# Patient Record
Sex: Male | Born: 1985 | Race: Black or African American | Hispanic: No | Marital: Single | Smoking: Current every day smoker
Health system: Southern US, Community
[De-identification: ages and names within clinical notes are randomized; demographics above are authoritative.]

## PROBLEM LIST (undated history)

## (undated) DIAGNOSIS — J189 Pneumonia, unspecified organism: Secondary | ICD-10-CM

---
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
## 2006-01-16 ENCOUNTER — Emergency Department (HOSPITAL_COMMUNITY): Admission: EM | Admit: 2006-01-16 | Discharge: 2006-01-16 | Payer: Self-pay | Admitting: Emergency Medicine

## 2010-02-08 ENCOUNTER — Emergency Department (HOSPITAL_COMMUNITY): Admission: EM | Admit: 2010-02-08 | Discharge: 2010-02-09 | Payer: Self-pay | Admitting: Emergency Medicine

## 2010-10-01 ENCOUNTER — Emergency Department (HOSPITAL_COMMUNITY)
Admission: EM | Admit: 2010-10-01 | Discharge: 2010-10-01 | Disposition: A | Discharge status: Home / Self Care | Payer: Self-pay | Attending: Emergency Medicine | Admitting: Emergency Medicine

## 2010-10-01 DIAGNOSIS — J029 Acute pharyngitis, unspecified: Secondary | ICD-10-CM | POA: Insufficient documentation

## 2012-02-18 ENCOUNTER — Emergency Department (HOSPITAL_COMMUNITY)
Admission: EM | Admit: 2012-02-18 | Discharge: 2012-02-19 | Disposition: A | Discharge status: Home / Self Care | Payer: Self-pay | Attending: Emergency Medicine | Admitting: Emergency Medicine

## 2012-02-18 ENCOUNTER — Encounter (HOSPITAL_COMMUNITY): Payer: Self-pay | Admitting: *Deleted

## 2012-02-18 DIAGNOSIS — R197 Diarrhea, unspecified: Secondary | ICD-10-CM | POA: Insufficient documentation

## 2012-02-18 DIAGNOSIS — R109 Unspecified abdominal pain: Secondary | ICD-10-CM | POA: Insufficient documentation

## 2012-02-18 DIAGNOSIS — R112 Nausea with vomiting, unspecified: Secondary | ICD-10-CM | POA: Insufficient documentation

## 2012-02-18 MED ORDER — DIPHENOXYLATE-ATROPINE 2.5-0.025 MG PO TABS
2.0000 | ORAL_TABLET | Freq: Once | ORAL | Status: AC
  Administered 2012-02-18: 2 via ORAL
  Filled 2012-02-18: qty 2

## 2012-02-18 MED ORDER — MORPHINE SULFATE 4 MG/ML IJ SOLN
6.0000 mg | Freq: Once | INTRAMUSCULAR | Status: AC
  Administered 2012-02-18: 6 mg via INTRAVENOUS
  Filled 2012-02-18: qty 2

## 2012-02-18 MED ORDER — ONDANSETRON HCL 4 MG/2ML IJ SOLN
6.0000 mg | Freq: Once | INTRAMUSCULAR | Status: AC
  Administered 2012-02-18: 6 mg via INTRAVENOUS
  Filled 2012-02-18: qty 4

## 2012-02-18 MED ORDER — SODIUM CHLORIDE 0.9 % IV BOLUS (SEPSIS)
1000.0000 mL | Freq: Once | INTRAVENOUS | Status: AC
  Administered 2012-02-18: 1000 mL via INTRAVENOUS

## 2012-02-18 MED ORDER — ONDANSETRON HCL 4 MG PO TABS: 4.0000 mg | ORAL_TABLET | Freq: Four times a day (QID) | ORAL | Status: DC

## 2012-02-18 NOTE — ED Notes (Signed)
Pt reporting improvement in pain and nausea.  States "I'm just tired now."  Pt to call family member for ride home.

## 2012-02-18 NOTE — ED Notes (Signed)
abd pain with NVD. chills

## 2012-02-18 NOTE — ED Notes (Signed)
Pt reporting generalized aches, nausea, diarrhea x2 days.  Denies known fever. Reporting fatigue and loss of appetite as well.

## 2012-02-18 NOTE — ED Provider Notes (Signed)
History  This chart was scribed for Raeford Razor, MD by Bennett Scrape. This patient was seen in room APA10/APA10 and the patient's care was started at 9:25PM.  CSN: [id number]  Arrival date & time 02/18/12  2044   First MD Initiated Contact with Patient 02/18/12 2125      Chief Complaint  Patient presents with  . Abdominal Pain     The history is provided by the patient. No language interpreter was used.   Carl Watson is a 26 y.o. male who presents to the Emergency Department complaining of 2 days of intermittent periumbilical abdominal pain described as sharp that occurs every 5 to 10 minutes with associated nausea, non-bloody emesis, non-bloody diarrhea and chills. He denies having any modifying factors and he reports taking Pepto and Gas X with mild improvement in his symptoms. He denies having any sick contacts with similar symptoms. He denies any recent out of country travels or antibiotic use. He denies fever, urinary symptoms, HA and rash as associated symptoms. He does not have a h/o chronic medical conditions and denies having prior abdominal surgery. He is a current everyday smoker and occasional alcohol user.   History reviewed. No pertinent past medical history.  History reviewed. No pertinent past surgical history.  History reviewed. No pertinent family history.  History  Substance Use Topics  . Smoking status: Current Every Day Smoker  . Smokeless tobacco: Not on file  . Alcohol Use: Yes      Review of Systems  Constitutional: Positive for chills. Negative for fever.  Gastrointestinal: Positive for nausea, vomiting, abdominal pain and diarrhea.  Genitourinary: Negative for dysuria, urgency and hematuria.  Musculoskeletal: Negative for myalgias.  All other systems reviewed and are negative.    Allergies  Review of patient's allergies indicates no known allergies.  Home Medications  No current outpatient prescriptions on file.  Triage Vitals: BP 150/76   Pulse 86  Temp 98.8 F (37.1 C) (Oral)  Resp 18  Ht 5\' 9"  (1.753 m)  Wt 185 lb (83.915 kg)  BMI 27.32 kg/m2  SpO2 98%  Physical Exam  Nursing note and vitals reviewed. Constitutional: He is oriented to person, place, and time. He appears well-developed and well-nourished.  HENT:  Head: Normocephalic and atraumatic.  Eyes: Conjunctivae normal and EOM are normal. Pupils are equal, round, and reactive to light.  Neck: Normal range of motion. Neck supple.  Cardiovascular: Normal rate, regular rhythm and normal heart sounds.   Pulmonary/Chest: Effort normal and breath sounds normal. No respiratory distress. He has no wheezes. He has no rales.  Abdominal: Soft. Bowel sounds are normal. He exhibits no distension and no mass. There is no tenderness. There is no rebound and no guarding.  Musculoskeletal: Normal range of motion.  Neurological: He is alert and oriented to person, place, and time.  Skin: Skin is warm and dry.  Psychiatric: He has a normal mood and affect.    ED Course  Procedures (including critical care time)  DIAGNOSTIC STUDIES: Oxygen Saturation is 98% on room air, normal by my interpretation.    COORDINATION OF CARE: 10:05PM-Discussed treatment plan which includes IV fluids and antiemetics with pt at bedside and pt agreed to plan.   Labs Reviewed - No data to display No results found.   1. Nausea and vomiting   2. Diarrhea   3. Abdominal pain       MDM  26yM with n/v/d. Suspect viral illness. Better with meds and IVF. Do not feel needs emergent  imaging or labs. Return precautions discussed. Continued symptomatic tx.      I personally preformed the services scribed in my presence. The recorded information has been reviewed and considered. Raeford Razor, MD.    Raeford Razor, MD 02/28/12 [phone]

## 2012-03-25 ENCOUNTER — Encounter (HOSPITAL_COMMUNITY): Payer: Self-pay | Admitting: *Deleted

## 2012-03-25 ENCOUNTER — Emergency Department (HOSPITAL_COMMUNITY): Payer: Self-pay

## 2012-03-25 ENCOUNTER — Emergency Department (HOSPITAL_COMMUNITY)
Admission: EM | Admit: 2012-03-25 | Discharge: 2012-03-25 | Disposition: A | Discharge status: Home / Self Care | Payer: Self-pay | Attending: Emergency Medicine | Admitting: Emergency Medicine

## 2012-03-25 DIAGNOSIS — S9030XA Contusion of unspecified foot, initial encounter: Secondary | ICD-10-CM | POA: Insufficient documentation

## 2012-03-25 DIAGNOSIS — W240XXA Contact with lifting devices, not elsewhere classified, initial encounter: Secondary | ICD-10-CM | POA: Insufficient documentation

## 2012-03-25 DIAGNOSIS — Y9269 Other specified industrial and construction area as the place of occurrence of the external cause: Secondary | ICD-10-CM | POA: Insufficient documentation

## 2012-03-25 DIAGNOSIS — Y990 Civilian activity done for income or pay: Secondary | ICD-10-CM | POA: Insufficient documentation

## 2012-03-25 DIAGNOSIS — S9000XA Contusion of unspecified ankle, initial encounter: Secondary | ICD-10-CM | POA: Insufficient documentation

## 2012-03-25 DIAGNOSIS — S9032XA Contusion of left foot, initial encounter: Secondary | ICD-10-CM

## 2012-03-25 DIAGNOSIS — F17200 Nicotine dependence, unspecified, uncomplicated: Secondary | ICD-10-CM | POA: Insufficient documentation

## 2012-03-25 DIAGNOSIS — S9002XA Contusion of left ankle, initial encounter: Secondary | ICD-10-CM

## 2012-03-25 MED ORDER — HYDROCODONE-ACETAMINOPHEN 5-325 MG PO TABS: 1.0000 | ORAL_TABLET | Freq: Four times a day (QID) | ORAL | Status: AC | PRN

## 2012-03-25 MED ORDER — IBUPROFEN 800 MG PO TABS
800.0000 mg | ORAL_TABLET | Freq: Once | ORAL | Status: AC
  Administered 2012-03-25: 800 mg via ORAL
  Filled 2012-03-25: qty 1

## 2012-03-25 MED ORDER — HYDROCODONE-ACETAMINOPHEN 5-325 MG PO TABS
1.0000 | ORAL_TABLET | Freq: Once | ORAL | Status: AC
  Administered 2012-03-25: 1 via ORAL
  Filled 2012-03-25: qty 1

## 2012-03-25 NOTE — ED Provider Notes (Signed)
Medical screening examination/treatment/procedure(s) were performed by non-physician practitioner and as supervising physician I was immediately available for consultation/collaboration.   Lyanne Co, MD 03/25/12 [phone]

## 2012-03-25 NOTE — ED Provider Notes (Signed)
History     CSN: [id number]  Arrival date & time 03/25/12  1528   First MD Initiated Contact with Patient 03/25/12 1600      Chief Complaint  Patient presents with  . Foot Injury    (Consider location/radiation/quality/duration/timing/severity/associated sxs/prior treatment) HPI Comments: Pt states his L foot and ankle have hurt most of the day but was acutely worse after getting home and taking his shoe off and getting off his foot.  Patient is a 26 y.o. male presenting with foot injury. The history is provided by the patient. No language interpreter was used.  Foot Injury  Incident onset: 0930 today. The incident occurred at work. Injury mechanism: forklift rolled onto L foot. The pain is present in the left foot and left ankle. The quality of the pain is described as throbbing. The pain is severe. The pain has been constant since onset. Associated symptoms include inability to bear weight. Pertinent negatives include no numbness, no loss of motion, no muscle weakness and no loss of sensation. He reports no foreign bodies present. The symptoms are aggravated by bearing weight and palpation. He has tried nothing for the symptoms.    History reviewed. No pertinent past medical history.  History reviewed. No pertinent past surgical history.  History reviewed. No pertinent family history.  History  Substance Use Topics  . Smoking status: Current Every Day Smoker    Types: Cigarettes  . Smokeless tobacco: Not on file  . Alcohol Use: Yes      Review of Systems  Constitutional: Negative for fever and chills.  Musculoskeletal:       Foot and ankle injuries.  Skin: Negative for wound.  Neurological: Negative for weakness and numbness.  All other systems reviewed and are negative.    Allergies  Review of patient's allergies indicates no known allergies.  Home Medications   Current Outpatient Rx  Name  Route  Sig  Dispense  Refill  . HYDROCODONE-ACETAMINOPHEN 5-325 MG  PO TABS   Oral   Take 1 tablet by mouth every 6 (six) hours as needed for pain.   20 tablet   0     BP 146/71  Pulse 83  Temp 98.9 F (37.2 C) (Oral)  Resp 20  Ht 5\' 10"  (1.778 m)  Wt 180 lb (81.647 kg)  BMI 25.83 kg/m2  SpO2 100%  Physical Exam  Nursing note and vitals reviewed. Constitutional: He is oriented to person, place, and time. He appears well-developed and well-nourished.  HENT:  Head: Normocephalic and atraumatic.  Eyes: EOM are normal.  Neck: Normal range of motion.  Cardiovascular: Normal rate, regular rhythm and intact distal pulses.   Pulmonary/Chest: Effort normal. No respiratory distress.  Abdominal: Soft. He exhibits no distension. There is no tenderness.  Musculoskeletal: He exhibits tenderness.       Left foot: He exhibits decreased range of motion, tenderness, bony tenderness and swelling.       Feet:  Neurological: He is alert and oriented to person, place, and time.  Skin: Skin is warm and dry.  Psychiatric: He has a normal mood and affect. Judgment normal.    ED Course  Procedures (including critical care time)  Labs Reviewed - No data to display Dg Ankle Complete Left  03/25/2012  *RADIOLOGY REPORT*  Clinical Data: Forklift injury.  Lateral foot and ankle pain.  LEFT ANKLE COMPLETE - 3+ VIEW  Comparison: None.  Findings: No acute osseous or joint abnormality.  No significant soft tissue swelling.  IMPRESSION:  Negative.   Original Report Authenticated By: Leanna Battles, M.D.    Dg Foot Complete Left  03/25/2012  *RADIOLOGY REPORT*  Clinical Data: Forklift injury.  Lateral foot and ankle pain.  LEFT FOOT - COMPLETE 3+ VIEW  Comparison: None.  Findings: There is lateral soft tissue swelling.  No acute osseous or joint abnormality.  IMPRESSION: Lateral soft tissue swelling.  No acute osseous or joint abnormality.   Original Report Authenticated By: Leanna Battles, M.D.      1. Contusion of left foot   2. Contusion of left ankle       MDM   ASO x 2-3 weeks. Crutches Ice, elevation Ibuprofen rx-hydrocodone, 20 F/u with dr.  Carollee Herter, PA 03/25/12 1634  Evalina Field, PA 03/25/12 [phone]

## 2012-03-25 NOTE — ED Notes (Signed)
Pain lt foot and ankle, fort lift ran over his foot this am.

## 2012-05-01 ENCOUNTER — Emergency Department (HOSPITAL_COMMUNITY): Payer: Self-pay

## 2012-05-01 ENCOUNTER — Encounter (HOSPITAL_COMMUNITY): Payer: Self-pay

## 2012-05-01 ENCOUNTER — Emergency Department (HOSPITAL_COMMUNITY)
Admission: EM | Admit: 2012-05-01 | Discharge: 2012-05-01 | Disposition: A | Discharge status: Home / Self Care | Payer: Self-pay | Attending: Emergency Medicine | Admitting: Emergency Medicine

## 2012-05-01 DIAGNOSIS — R42 Dizziness and giddiness: Secondary | ICD-10-CM | POA: Insufficient documentation

## 2012-05-01 DIAGNOSIS — H9209 Otalgia, unspecified ear: Secondary | ICD-10-CM | POA: Insufficient documentation

## 2012-05-01 DIAGNOSIS — M549 Dorsalgia, unspecified: Secondary | ICD-10-CM | POA: Insufficient documentation

## 2012-05-01 DIAGNOSIS — J111 Influenza due to unidentified influenza virus with other respiratory manifestations: Secondary | ICD-10-CM

## 2012-05-01 DIAGNOSIS — R5383 Other fatigue: Secondary | ICD-10-CM | POA: Insufficient documentation

## 2012-05-01 DIAGNOSIS — R1110 Vomiting, unspecified: Secondary | ICD-10-CM | POA: Insufficient documentation

## 2012-05-01 DIAGNOSIS — R6889 Other general symptoms and signs: Secondary | ICD-10-CM | POA: Insufficient documentation

## 2012-05-01 DIAGNOSIS — R05 Cough: Secondary | ICD-10-CM | POA: Insufficient documentation

## 2012-05-01 DIAGNOSIS — J029 Acute pharyngitis, unspecified: Secondary | ICD-10-CM | POA: Insufficient documentation

## 2012-05-01 DIAGNOSIS — J3489 Other specified disorders of nose and nasal sinuses: Secondary | ICD-10-CM | POA: Insufficient documentation

## 2012-05-01 DIAGNOSIS — R51 Headache: Secondary | ICD-10-CM | POA: Insufficient documentation

## 2012-05-01 DIAGNOSIS — R5381 Other malaise: Secondary | ICD-10-CM | POA: Insufficient documentation

## 2012-05-01 DIAGNOSIS — IMO0001 Reserved for inherently not codable concepts without codable children: Secondary | ICD-10-CM | POA: Insufficient documentation

## 2012-05-01 DIAGNOSIS — R0602 Shortness of breath: Secondary | ICD-10-CM | POA: Insufficient documentation

## 2012-05-01 DIAGNOSIS — R059 Cough, unspecified: Secondary | ICD-10-CM | POA: Insufficient documentation

## 2012-05-01 DIAGNOSIS — F17200 Nicotine dependence, unspecified, uncomplicated: Secondary | ICD-10-CM | POA: Insufficient documentation

## 2012-05-01 DIAGNOSIS — R079 Chest pain, unspecified: Secondary | ICD-10-CM | POA: Insufficient documentation

## 2012-05-01 DIAGNOSIS — J1189 Influenza due to unidentified influenza virus with other manifestations: Secondary | ICD-10-CM | POA: Insufficient documentation

## 2012-05-01 DIAGNOSIS — R062 Wheezing: Secondary | ICD-10-CM | POA: Insufficient documentation

## 2012-05-01 LAB — COMPREHENSIVE METABOLIC PANEL
ALT: 41 U/L (ref 0–53)
AST: 36 U/L (ref 0–37)
Alkaline Phosphatase: 82 U/L (ref 39–117)
BUN: 13 mg/dL (ref 6–23)
CO2: 22 mEq/L (ref 19–32)
Creatinine, Ser: 1.26 mg/dL (ref 0.50–1.35)
GFR calc non Af Amer: 78 mL/min — ABNORMAL LOW (ref >90)
Potassium: 3.2 mEq/L — ABNORMAL LOW (ref 3.5–5.1)
Total Bilirubin: 0.9 mg/dL (ref 0.3–1.2)

## 2012-05-01 LAB — CBC WITH DIFFERENTIAL/PLATELET
Eosinophils Absolute: 0 10*3/uL (ref 0.0–0.7)
HCT: 43.8 % (ref 39.0–52.0)
Lymphs Abs: 0.5 10*3/uL — ABNORMAL LOW (ref 0.7–4.0)
MCH: 32.2 pg (ref 26.0–34.0)
Monocytes Relative: 9 % (ref 3–12)
Neutro Abs: 9.8 10*3/uL — ABNORMAL HIGH (ref 1.7–7.7)
Neutrophils Relative %: 87 % — ABNORMAL HIGH (ref 43–77)
WBC: 11.4 10*3/uL — ABNORMAL HIGH (ref 4.0–10.5)

## 2012-05-01 LAB — URINE MICROSCOPIC-ADD ON

## 2012-05-01 LAB — URINALYSIS, ROUTINE W REFLEX MICROSCOPIC
Ketones, ur: 15 mg/dL — AB
Protein, ur: 30 mg/dL — AB
Specific Gravity, Urine: 1.025 (ref 1.005–1.030)
Urobilinogen, UA: 1 mg/dL (ref 0.0–1.0)

## 2012-05-01 MED ORDER — ACETAMINOPHEN 500 MG PO TABS
ORAL_TABLET | ORAL | Status: AC
  Administered 2012-05-01: 1000 mg
  Filled 2012-05-01: qty 2

## 2012-05-01 MED ORDER — OXYCODONE-ACETAMINOPHEN 5-325 MG PO TABS: 1.0000 | ORAL_TABLET | ORAL | Status: DC | PRN

## 2012-05-01 MED ORDER — IBUPROFEN 800 MG PO TABS
ORAL_TABLET | ORAL | Status: AC
  Administered 2012-05-01: 800 mg
  Filled 2012-05-01: qty 1

## 2012-05-01 NOTE — ED Notes (Signed)
Pt with complaints of fever, cough, and congestion for 3 days

## 2012-05-01 NOTE — ED Notes (Addendum)
Fever, body aches, x 3 days,  Cough,  Vomiting. Stress incontinence when coughs    Alert,  No rash.    Moaning, back aches.

## 2012-05-01 NOTE — ED Provider Notes (Signed)
History     CSN: [id number]  Arrival date & time 05/01/12  1249   None     Chief Complaint  Patient presents with  . Fever    HPI Carl Watson is a 26 y.o. male who presents to the ED with fever. The fever started 3 days ago. Temp. Up to 103.  Associated symptoms include cough, aching all over and sore throat. Had the flu years ago and felt the same. The history was provided by the patient.  History reviewed. No pertinent past medical history.  History reviewed. No pertinent past surgical history.  No family history on file.  History  Substance Use Topics  . Smoking status: Current Every Day Smoker    Types: Cigarettes  . Smokeless tobacco: Not on file  . Alcohol Use: Yes      Review of Systems  Constitutional: Positive for fever, chills and fatigue. Negative for diaphoresis.  HENT: Positive for ear pain, congestion, sore throat and sneezing. Negative for facial swelling, neck pain, neck stiffness, dental problem and sinus pressure.   Eyes: Negative for photophobia, pain and discharge.  Respiratory: Positive for cough, shortness of breath and wheezing. Negative for chest tightness.   Cardiovascular: Positive for chest pain.  Gastrointestinal: Positive for vomiting. Negative for nausea, abdominal pain, diarrhea, constipation and abdominal distention.  Genitourinary: Positive for dysuria. Negative for frequency, flank pain and difficulty urinating.  Musculoskeletal: Positive for myalgias and back pain. Negative for gait problem.  Skin: Negative for color change and rash.  Neurological: Positive for dizziness, light-headedness and headaches. Negative for speech difficulty, weakness and numbness.  Psychiatric/Behavioral: Negative for confusion and agitation. The patient is not nervous/anxious.     Allergies  Review of patient's allergies indicates no known allergies.  Home Medications   Current Outpatient Rx  Name  Route  Sig  Dispense  Refill  . ACETAMINOPHEN 500  MG PO TABS   Oral   Take 1,000 mg by mouth every 6 (six) hours as needed. Pain         . OXYCODONE-ACETAMINOPHEN 5-325 MG PO TABS   Oral   Take 1 tablet by mouth every 4 (four) hours as needed for pain. May take 1 or 2 tablets every 4 to 6 hours for cough and pain   20 tablet   0     BP 114/48  Pulse 108  Temp 99.9 F (37.7 C) (Oral)  Resp 20  Ht 5\' 9"  (1.753 m)  Wt 170 lb (77.111 kg)  BMI 25.10 kg/m2  SpO2 96%  Physical Exam  Nursing note and vitals reviewed. Constitutional: He is oriented to person, place, and time. He appears well-developed and well-nourished. No distress.       Uncomfortable appearing, temp 103.1   HENT:  Head: Normocephalic.  Right Ear: External ear normal. No drainage. Tympanic membrane is not erythematous.  Left Ear: External ear normal. No drainage. Tympanic membrane is not erythematous.  Mouth/Throat: Uvula is midline and mucous membranes are normal. No uvula swelling. Posterior oropharyngeal erythema present.  Eyes: Conjunctivae normal and EOM are normal.  Neck: Neck supple.  Cardiovascular:       Tachycardia   Pulmonary/Chest: Effort normal. He has no wheezes. He has no rales.  Abdominal: Soft. There is no tenderness.  Musculoskeletal: Normal range of motion.  Neurological: He is alert and oriented to person, place, and time.  Skin:       Increased warmth due to fever  Psychiatric: He has a normal mood  and affect. His behavior is normal. Judgment and thought content normal.   Results for orders placed during the hospital encounter of 05/01/12 (from the past 24 hour(s))  COMPREHENSIVE METABOLIC PANEL     Status: Abnormal   Collection Time   05/01/12  2:27 PM      Component Value Range   Sodium 131 (*) 135 - 145 mEq/L   Potassium 3.2 (*) 3.5 - 5.1 mEq/L   Chloride 97  96 - 112 mEq/L   CO2 22  19 - 32 mEq/L   Glucose, Bld 103 (*) 70 - 99 mg/dL   BUN 13  6 - 23 mg/dL   Creatinine, Ser 0.98  0.50 - 1.35 mg/dL   Calcium 9.1  8.4 - 11.9  mg/dL   Total Protein 7.5  6.0 - 8.3 g/dL   Albumin 4.3  3.5 - 5.2 g/dL   AST 36  0 - 37 U/L   ALT 41  0 - 53 U/L   Alkaline Phosphatase 82  39 - 117 U/L   Total Bilirubin 0.9  0.3 - 1.2 mg/dL   GFR calc non Af Amer 78 (*) >90 mL/min   GFR calc Af Amer 90 (*) >90 mL/min  CBC WITH DIFFERENTIAL     Status: Abnormal   Collection Time   05/01/12  2:27 PM      Component Value Range   WBC 11.4 (*) 4.0 - 10.5 K/uL   RBC 4.85  4.22 - 5.81 MIL/uL   Hemoglobin 15.6  13.0 - 17.0 g/dL   HCT 14.7  82.9 - 56.2 %   MCV 90.3  78.0 - 100.0 fL   MCH 32.2  26.0 - 34.0 pg   MCHC 35.6  30.0 - 36.0 g/dL   RDW 13.0  86.5 - 78.4 %   Platelets 171  150 - 400 K/uL   Neutrophils Relative 87 (*) 43 - 77 %   Neutro Abs 9.8 (*) 1.7 - 7.7 K/uL   Lymphocytes Relative 5 (*) 12 - 46 %   Lymphs Abs 0.5 (*) 0.7 - 4.0 K/uL   Monocytes Relative 9  3 - 12 %   Monocytes Absolute 1.0  0.1 - 1.0 K/uL   Eosinophils Relative 0  0 - 5 %   Eosinophils Absolute 0.0  0.0 - 0.7 K/uL   Basophils Relative 0  0 - 1 %   Basophils Absolute 0.0  0.0 - 0.1 K/uL  URINALYSIS, ROUTINE W REFLEX MICROSCOPIC     Status: Abnormal   Collection Time   05/01/12  2:45 PM      Component Value Range   Color, Urine YELLOW  YELLOW   APPearance CLEAR  CLEAR   Specific Gravity, Urine 1.025  1.005 - 1.030   pH 6.0  5.0 - 8.0   Glucose, UA NEGATIVE  NEGATIVE mg/dL   Hgb urine dipstick SMALL (*) NEGATIVE   Bilirubin Urine SMALL (*) NEGATIVE   Ketones, ur 15 (*) NEGATIVE mg/dL   Protein, ur 30 (*) NEGATIVE mg/dL   Urobilinogen, UA 1.0  0.0 - 1.0 mg/dL   Nitrite NEGATIVE  NEGATIVE   Leukocytes, UA NEGATIVE  NEGATIVE  URINE MICROSCOPIC-ADD ON     Status: Normal   Collection Time   05/01/12  2:45 PM      Component Value Range   Squamous Epithelial / LPF RARE  RARE   WBC, UA 0-2  <3 WBC/hpf   RBC / HPF 0-2  <3 RBC/hpf   Bacteria, UA RARE  RARE    Procedures  Assessment: 26 y.o. male with influenza  Plan:  Percocet for pain and  fever   Ibuprofen   Bed rest, push fluids I have reviewed this patient's vital signs, nurses notes, appropriate labs and imaging. I have discussed findings with the patient and plan of care.   Medication List     As of 05/01/2012  3:47 PM    START taking these medications         oxyCODONE-acetaminophen 5-325 MG per tablet   Commonly known as: PERCOCET/ROXICET   Take 1 tablet by mouth every 4 (four) hours as needed for pain. May take 1 or 2 tablets every 4 to 6 hours for cough and pain      ASK your doctor about these medications         acetaminophen 500 MG tablet   Commonly known as: TYLENOL          Where to get your medications    These are the prescriptions that you need to pick up.   You may get these medications from any pharmacy.         oxyCODONE-acetaminophen 5-325 MG per tablet           .      [facility] Orlene Och, [place] 05/01/12 [phone]

## 2012-05-01 NOTE — ED Notes (Signed)
Drinking ginger ale.

## 2012-05-01 NOTE — ED Provider Notes (Signed)
Medical screening examination/treatment/procedure(s) were performed by non-physician practitioner and as supervising physician I was immediately available for consultation/collaboration.   Joya Gaskins, MD 05/01/12 [phone]

## 2012-12-09 ENCOUNTER — Emergency Department (HOSPITAL_COMMUNITY)
Admission: EM | Admit: 2012-12-09 | Discharge: 2012-12-09 | Disposition: A | Discharge status: Home / Self Care | Payer: 59 | Attending: Emergency Medicine | Admitting: Emergency Medicine

## 2012-12-09 ENCOUNTER — Encounter (HOSPITAL_COMMUNITY): Payer: Self-pay | Admitting: Emergency Medicine

## 2012-12-09 ENCOUNTER — Emergency Department (HOSPITAL_COMMUNITY): Payer: 59

## 2012-12-09 DIAGNOSIS — K2970 Gastritis, unspecified, without bleeding: Secondary | ICD-10-CM | POA: Insufficient documentation

## 2012-12-09 DIAGNOSIS — F17200 Nicotine dependence, unspecified, uncomplicated: Secondary | ICD-10-CM | POA: Insufficient documentation

## 2012-12-09 DIAGNOSIS — K5900 Constipation, unspecified: Secondary | ICD-10-CM | POA: Insufficient documentation

## 2012-12-09 DIAGNOSIS — R1110 Vomiting, unspecified: Secondary | ICD-10-CM | POA: Insufficient documentation

## 2012-12-09 DIAGNOSIS — K2990 Gastroduodenitis, unspecified, without bleeding: Secondary | ICD-10-CM | POA: Insufficient documentation

## 2012-12-09 DIAGNOSIS — R197 Diarrhea, unspecified: Secondary | ICD-10-CM | POA: Insufficient documentation

## 2012-12-09 LAB — COMPREHENSIVE METABOLIC PANEL
BUN: 11 mg/dL (ref 6–23)
CO2: 24 mEq/L (ref 19–32)
Chloride: 103 mEq/L (ref 96–112)
Creatinine, Ser: 1.09 mg/dL (ref 0.50–1.35)
GFR calc non Af Amer: >90 mL/min (ref >90)
Glucose, Bld: 113 mg/dL — ABNORMAL HIGH (ref 70–99)
Total Bilirubin: 0.4 mg/dL (ref 0.3–1.2)

## 2012-12-09 LAB — CBC WITH DIFFERENTIAL/PLATELET
HCT: 46.5 % (ref 39.0–52.0)
Hemoglobin: 16.3 g/dL (ref 13.0–17.0)
Lymphocytes Relative: 24 % (ref 12–46)
MCHC: 35.1 g/dL (ref 30.0–36.0)
Monocytes Absolute: 0.6 10*3/uL (ref 0.1–1.0)
Monocytes Relative: 7 % (ref 3–12)
Neutro Abs: 6.4 10*3/uL (ref 1.7–7.7)
WBC: 9.6 10*3/uL (ref 4.0–10.5)

## 2012-12-09 LAB — LIPASE, BLOOD: Lipase: 28 U/L (ref 11–59)

## 2012-12-09 LAB — OCCULT BLOOD, POC DEVICE: Fecal Occult Bld: NEGATIVE

## 2012-12-09 MED ORDER — DOCUSATE SODIUM 100 MG PO CAPS: 100.0000 mg | ORAL_CAPSULE | Freq: Two times a day (BID) | ORAL | Status: DC

## 2012-12-09 MED ORDER — GI COCKTAIL ~~LOC~~
30.0000 mL | Freq: Once | ORAL | Status: AC
  Administered 2012-12-09: 30 mL via ORAL
  Filled 2012-12-09: qty 30

## 2012-12-09 MED ORDER — SODIUM CHLORIDE 0.9 % IV BOLUS (SEPSIS)
1000.0000 mL | Freq: Once | INTRAVENOUS | Status: AC
  Administered 2012-12-09: 1000 mL via INTRAVENOUS

## 2012-12-09 MED ORDER — RANITIDINE HCL 150 MG PO TABS: 150.0000 mg | ORAL_TABLET | Freq: Two times a day (BID) | ORAL | Status: DC

## 2012-12-09 MED ORDER — PROMETHAZINE HCL 25 MG PO TABS: 25.0000 mg | ORAL_TABLET | Freq: Four times a day (QID) | ORAL | Status: DC | PRN

## 2012-12-09 NOTE — ED Notes (Signed)
Pt c/o mid abd pain with n/v/d x 2 days. Only vomited Monday, none since. Diarrhea in last 24 hrs x 5 per pt. Mm slightly moist. Grimacing noted. Denies black or bloody emesis/stool. Denies urinary changes. Sharp pains to mid abd.

## 2012-12-09 NOTE — ED Provider Notes (Signed)
CSN: [id number]     Arrival date & time 12/09/12  4098 History    This chart was scribed for Derwood Kaplan, MD by Leone Payor, ED Scribe. This patient was seen in room APA09/APA09 and the patient's care was started 7:11 AM.   First MD Initiated Contact with Patient 12/09/12 [phone]     Chief Complaint  Patient presents with  . Abdominal Pain    The history is provided by the patient. No language interpreter was used.    HPI Comments: Carl Watson is a 27 y.o. male who presents to the Emergency Department complaining of intermittent, gradually worsening mid abdominal pain starting 3-4 days ago. States the pain lasts for 30 seconds and comes every 10-15 minutes. He describes this pain as sharp and stinging. He has had associated episodes of vomiting 3 days ago and 5-6 episodes of diarrhea in the last 24 hours. States he has relief from abdominal pain during sleep. Pt reports having last BM this morning. Denies history of abdominal surgeries. Denies suspect food intake. Denies similar symptoms in the past.   Pt admits to marijuana use. No other street drug use.    History reviewed. No pertinent past medical history. History reviewed. No pertinent past surgical history. History reviewed. No pertinent family history. History  Substance Use Topics  . Smoking status: Current Every Day Smoker    Types: Cigarettes  . Smokeless tobacco: Not on file  . Alcohol Use: Yes     Comment: weekends/wine and beer    Review of Systems  Gastrointestinal: Positive for vomiting, abdominal pain and diarrhea. Negative for blood in stool.  Genitourinary: Negative for flank pain.  All other systems reviewed and are negative.    Allergies  Review of patient's allergies indicates no known allergies.  Home Medications   Current Outpatient Rx  Name  Route  Sig  Dispense  Refill  . acetaminophen (TYLENOL) 500 MG tablet   Oral   Take 1,000 mg by mouth every 6 (six) hours as needed. Pain         .  oxyCODONE-acetaminophen (PERCOCET/ROXICET) 5-325 MG per tablet   Oral   Take 1 tablet by mouth every 4 (four) hours as needed for pain. May take 1 or 2 tablets every 4 to 6 hours for cough and pain   20 tablet   0    BP 132/85  Pulse 66  Temp(Src) 97.8 F (36.6 C)  Resp 20  Ht 5\' 11"  (1.803 m)  Wt 185 lb (83.915 kg)  BMI 25.81 kg/m2  SpO2 100% Physical Exam  Nursing note and vitals reviewed. Constitutional: He is oriented to person, place, and time. He appears well-developed and well-nourished.  HENT:  Head: Normocephalic and atraumatic.  Eyes: Conjunctivae and EOM are normal. Pupils are equal, round, and reactive to light.  Neck: Normal range of motion. Neck supple.  Cardiovascular: Normal rate, regular rhythm and normal heart sounds.  Exam reveals no gallop and no friction rub.   No murmur heard. Pulmonary/Chest: Effort normal and breath sounds normal. No respiratory distress. He has no wheezes. He has no rales. He exhibits no tenderness.  Abdominal: Soft. Bowel sounds are normal. He exhibits no distension and no mass. There is no rigidity, no rebound, no guarding, no tenderness at McBurney's point and negative Murphy's sign.  Tender in periumbilical region. + bowel sounds  Genitourinary:  No flank tenderness.  Musculoskeletal: Normal range of motion.  Neurological: He is alert and oriented to person, place, and  time.  Skin: Skin is warm and dry.  Psychiatric: He has a normal mood and affect.     ED Course   Procedures (including critical care time)  DIAGNOSTIC STUDIES: Oxygen Saturation is 100% on RA, normal by my interpretation.    COORDINATION OF CARE: 7:28 AM Discussed treatment plan with pt at bedside and pt agreed to plan.   Labs Reviewed - No data to display No results found. No diagnosis found.  MDM  I personally performed the services described in this documentation, which was scribed in my presence. The recorded information has been reviewed and is  accurate.  DDx includes: Pancreatitis Hepatobiliary pathology including cholecystitis Gastritis/PUD SBO ACS syndrome Aortic Dissection  Pt comes in with cc of abd pain. Pt has periumbilical, epigastric pain, intermittent, and with similar hx in the past. Non surgical abdomen. No risk factors for ulcers, and he is guaic negative.  Unsure what the etiology is, basic labs are normal. Gastritis possible, as hehas been having similar sx in the past - and advised to see GI doctor if his sx are persistent.   Derwood Kaplan, MD 12/09/12 [phone]

## 2013-01-19 ENCOUNTER — Encounter (HOSPITAL_COMMUNITY): Payer: Self-pay | Admitting: *Deleted

## 2013-01-19 ENCOUNTER — Emergency Department (HOSPITAL_COMMUNITY)
Admission: EM | Admit: 2013-01-19 | Discharge: 2013-01-19 | Disposition: A | Discharge status: Home / Self Care | Payer: 59 | Attending: Emergency Medicine | Admitting: Emergency Medicine

## 2013-01-19 ENCOUNTER — Emergency Department (HOSPITAL_COMMUNITY): Payer: 59

## 2013-01-19 DIAGNOSIS — K5289 Other specified noninfective gastroenteritis and colitis: Secondary | ICD-10-CM | POA: Insufficient documentation

## 2013-01-19 DIAGNOSIS — R112 Nausea with vomiting, unspecified: Secondary | ICD-10-CM | POA: Insufficient documentation

## 2013-01-19 DIAGNOSIS — Z79899 Other long term (current) drug therapy: Secondary | ICD-10-CM | POA: Insufficient documentation

## 2013-01-19 DIAGNOSIS — K529 Noninfective gastroenteritis and colitis, unspecified: Secondary | ICD-10-CM

## 2013-01-19 DIAGNOSIS — F17200 Nicotine dependence, unspecified, uncomplicated: Secondary | ICD-10-CM | POA: Insufficient documentation

## 2013-01-19 DIAGNOSIS — R42 Dizziness and giddiness: Secondary | ICD-10-CM | POA: Insufficient documentation

## 2013-01-19 DIAGNOSIS — R197 Diarrhea, unspecified: Secondary | ICD-10-CM | POA: Insufficient documentation

## 2013-01-19 LAB — CBC
HCT: 47.1 % (ref 39.0–52.0)
Hemoglobin: 16.7 g/dL (ref 13.0–17.0)
RDW: 12.3 % (ref 11.5–15.5)
WBC: 9.1 10*3/uL (ref 4.0–10.5)

## 2013-01-19 LAB — LACTIC ACID, PLASMA: Lactic Acid, Venous: 1.4 mmol/L (ref 0.5–2.2)

## 2013-01-19 LAB — COMPREHENSIVE METABOLIC PANEL
AST: 20 U/L (ref 0–37)
Albumin: 4.2 g/dL (ref 3.5–5.2)
Calcium: 9.5 mg/dL (ref 8.4–10.5)
Creatinine, Ser: 1.07 mg/dL (ref 0.50–1.35)
GFR calc non Af Amer: >90 mL/min (ref >90)

## 2013-01-19 LAB — LIPASE, BLOOD: Lipase: 196 U/L — ABNORMAL HIGH (ref 11–59)

## 2013-01-19 MED ORDER — IOHEXOL 300 MG/ML  SOLN
100.0000 mL | Freq: Once | INTRAMUSCULAR | Status: AC | PRN
  Administered 2013-01-19: 100 mL via INTRAVENOUS

## 2013-01-19 MED ORDER — MORPHINE SULFATE 4 MG/ML IJ SOLN
4.0000 mg | Freq: Once | INTRAMUSCULAR | Status: AC
  Administered 2013-01-19: 4 mg via INTRAVENOUS
  Filled 2013-01-19: qty 1

## 2013-01-19 MED ORDER — SODIUM CHLORIDE 0.9 % IV BOLUS (SEPSIS)
1000.0000 mL | Freq: Once | INTRAVENOUS | Status: AC
  Administered 2013-01-19: 1000 mL via INTRAVENOUS

## 2013-01-19 MED ORDER — ONDANSETRON HCL 4 MG/2ML IJ SOLN
4.0000 mg | Freq: Once | INTRAMUSCULAR | Status: AC
  Administered 2013-01-19: 4 mg via INTRAVENOUS
  Filled 2013-01-19: qty 2

## 2013-01-19 MED ORDER — IOHEXOL 300 MG/ML  SOLN
50.0000 mL | Freq: Once | INTRAMUSCULAR | Status: AC | PRN
  Administered 2013-01-19: 50 mL via ORAL

## 2013-01-19 MED ORDER — ONDANSETRON 4 MG PO TBDP: ORAL_TABLET | ORAL | Status: DC

## 2013-01-19 NOTE — ED Notes (Addendum)
Pt c/o n/v/d, abd pain, fever since Sunday, woke up nauseous and dizzy this am, daughter and nephew have been sick with same symptoms,

## 2013-01-19 NOTE — ED Provider Notes (Signed)
CSN: [id number]     Arrival date & time 01/19/13  1245 History  This chart was scribed for Dagmar Hait, MD by Caryn Bee, ED Scribe. This patient was seen in room APA01/APA01 and the patient's care was started 2:05 PM.     Chief Complaint  Patient presents with  . Abdominal Pain   Patient is a 27 y.o. male presenting with abdominal pain. The history is provided by the patient. No language interpreter was used.  Abdominal Pain Pain location:  Periumbilical Pain quality: sharp and stabbing   Pain radiates to:  Does not radiate Pain severity:  Mild Onset quality:  Gradual Duration:  5 hours Timing:  Intermittent Progression:  Unchanged Chronicity:  New Context: sick contacts   Relieved by:  None tried Worsened by:  Nothing tried Ineffective treatments:  None tried Associated symptoms: diarrhea, nausea and vomiting   Associated symptoms: no cough, no fever and no hematemesis    HPI Comments: OTONIEL MYHAND is a 27 y.o. male who presents to the Emergency Department complaining of intermittent, "sharp, stabbing" periumbilical abdominal pain that began this morning. Pt states he has 5 second episodes of abdominal pain every 15 minutes. He reports dizziness that is worse with standing, multiple episodes of diarrhea without blood, and multiple episodes of emesis without blood as associated symptoms over the past 2 days. Pt also reports that he had a fever 2 days ago, which resolved, after taking Motrin. Pt reports multiple sick contacts including his daughter, nephew, and niece. The sick contacts had similar symptoms recently which they were seen in the ED for. Pt denies recent travelling and denies recent antibiotic use. He denies having any surgical abdominal history. He denies hematemesis, testicular pain or any other symptoms. Pt is a daily smoker and also uses alcohol occasionally.   History reviewed. No pertinent past medical history. History reviewed. No pertinent past  surgical history. No family history on file. History  Substance Use Topics  . Smoking status: Current Every Day Smoker    Types: Cigarettes  . Smokeless tobacco: Not on file  . Alcohol Use: Yes     Comment: weekends/wine and beer    Review of Systems  Constitutional: Negative for fever.  Respiratory: Negative for cough.   Gastrointestinal: Positive for nausea, vomiting, abdominal pain and diarrhea. Negative for hematemesis.  Genitourinary: Negative for testicular pain.  Neurological: Positive for dizziness.  All other systems reviewed and are negative.   Allergies  Review of patient's allergies indicates no known allergies.  Home Medications   Current Outpatient Rx  Name  Route  Sig  Dispense  Refill  . acetaminophen (TYLENOL) 500 MG tablet   Oral   Take 1,000 mg by mouth every 6 (six) hours as needed. Pain         . bismuth subsalicylate (PEPTO BISMOL) 262 MG/15ML suspension   Oral   Take 15 mLs by mouth every 6 (six) hours as needed for indigestion.         . docusate sodium (COLACE) 100 MG capsule   Oral   Take 1 capsule (100 mg total) by mouth every 12 (twelve) hours.   60 capsule   0   . promethazine (PHENERGAN) 25 MG tablet   Oral   Take 1 tablet (25 mg total) by mouth every 6 (six) hours as needed for nausea.   30 tablet   0   . ranitidine (ZANTAC) 150 MG tablet   Oral   Take 1 tablet (150  mg total) by mouth 2 (two) times daily.   60 tablet   0   . simethicone (MYLICON) 80 MG chewable tablet   Oral   Chew 160 mg by mouth every 6 (six) hours as needed for flatulence.          Triage Vitals: BP 128/80  Pulse 81  Temp(Src) 98.4 F (36.9 C) (Oral)  Resp 20  SpO2 100%  Physical Exam  Nursing note and vitals reviewed. Constitutional: He is oriented to person, place, and time. He appears well-developed and well-nourished. No distress.  HENT:  Head: Normocephalic and atraumatic.  Eyes: EOM are normal.  Neck: Neck supple. No tracheal  deviation present.  Cardiovascular: Normal rate, regular rhythm and normal heart sounds.   Pulmonary/Chest: Effort normal and breath sounds normal. No respiratory distress.  Abdominal: Soft. He exhibits no distension and no mass. There is no tenderness. There is no rebound and no guarding.  Genitourinary: Testes normal and penis normal. No penile tenderness.  Musculoskeletal: Normal range of motion.  Neurological: He is alert and oriented to person, place, and time.  Skin: Skin is warm and dry.  Psychiatric: He has a normal mood and affect. His behavior is normal.    ED Course  Procedures (including critical care time)  DIAGNOSTIC STUDIES: Oxygen Saturation is 100% on room air, normal by my interpretation.    COORDINATION OF CARE: 2:10 PM-Advised pt of plan for diagnostic lab work and radiology, along with plan to receive Zofran and IV fluids in the ED. Discussed treatment plan with pt at bedside and pt agreed to plan.   Medications  sodium chloride 0.9 % bolus 1,000 mL (1,000 mLs Intravenous New Bag/Given 01/19/13 1543)  sodium chloride 0.9 % bolus 1,000 mL (0 mLs Intravenous Stopped 01/19/13 1543)  ondansetron (ZOFRAN) injection 4 mg (4 mg Intravenous Given 01/19/13 1434)  iohexol (OMNIPAQUE) 300 MG/ML solution 50 mL (50 mLs Oral Contrast Given 01/19/13 1538)    Labs Review Labs Reviewed  LIPASE, BLOOD - Abnormal; Notable for the following:    Lipase 196 (*)    All other components within normal limits  COMPREHENSIVE METABOLIC PANEL  LACTIC ACID, PLASMA  CBC   Imaging Review Ct Abdomen Pelvis W Contrast  01/19/2013   CLINICAL DATA:  Abdominal pain. Vomiting. Diarrhea. Dizziness. Fever.  EXAM: CT ABDOMEN AND PELVIS WITH CONTRAST  TECHNIQUE: Multidetector CT imaging of the abdomen and pelvis was performed using the standard protocol following bolus administration of intravenous contrast.  CONTRAST:  OMNIPAQUE IOHEXOL 300 MG/ML  SOLN  COMPARISON:  01/16/2006  FINDINGS: The liver,  gallbladder, pancreas, spleen, adrenal glands, and kidneys are normal appearance. No evidence of hydronephrosis. No soft tissue masses or lymphadenopathy identified within the abdomen or pelvis.  No evidence of inflammatory process or abnormal fluid collections. Normal appendix visualized. No evidence of bowel wall thickening, dilatation, or hernia.  IMPRESSION: Negative. No acute findings or other significant abnormality identified.   Electronically Signed   By: Myles Rosenthal   On: 01/19/2013 17:23    MDM   1. Gastroenteritis    27 year old male presents with chills no fever, vomiting, diarrhea for the past several days. Multiple sick contacts with similar illnesses. Patient is very mild intermittent abdominal pain lasting 5-6 seconds at a time in his right lower quadrant. He has had none today. He has had no fevers today. He has no medical history. His vitals are stable here. On exam, he has a benign abdomen with no tenderness. Normal  GU exam. Concern for gastroenteritis. I will check labs and give fluids and antiemetics. Labs show elevated lipase. Patient has reported some intermittent abdominal pains these been here. Elevated lipase and then pain, will CT scan at this time. CT normal. Patient symptoms likely secondary to gastroenteritis. Patient is feeling better inflammatory. Stable for discharge. Zofran given.  I have reviewed all labs and imaging and considered them in my medical decision making.   I personally performed the services described in this documentation, which was scribed in my presence. The recorded information has been reviewed and is accurate.    Dagmar Hait, MD 01/19/13 [phone]

## 2014-04-25 ENCOUNTER — Encounter (HOSPITAL_COMMUNITY): Payer: Self-pay | Admitting: Emergency Medicine

## 2014-04-25 ENCOUNTER — Emergency Department (HOSPITAL_COMMUNITY)
Admission: EM | Admit: 2014-04-25 | Discharge: 2014-04-25 | Disposition: A | Discharge status: Home / Self Care | Payer: 59 | Attending: Emergency Medicine | Admitting: Emergency Medicine

## 2014-04-25 DIAGNOSIS — Z720 Tobacco use: Secondary | ICD-10-CM | POA: Insufficient documentation

## 2014-04-25 DIAGNOSIS — L02215 Cutaneous abscess of perineum: Secondary | ICD-10-CM | POA: Insufficient documentation

## 2014-04-25 DIAGNOSIS — L02219 Cutaneous abscess of trunk, unspecified: Secondary | ICD-10-CM

## 2014-04-25 MED ORDER — HYDROCODONE-ACETAMINOPHEN 5-325 MG PO TABS: 1.0000 | ORAL_TABLET | ORAL | Status: DC | PRN

## 2014-04-25 MED ORDER — SULFAMETHOXAZOLE-TRIMETHOPRIM 800-160 MG PO TABS: 1.0000 | ORAL_TABLET | Freq: Two times a day (BID) | ORAL | Status: AC

## 2014-04-25 MED ORDER — LIDOCAINE HCL (PF) 2 % IJ SOLN
10.0000 mL | Freq: Once | INTRAMUSCULAR | Status: DC
  Filled 2014-04-25: qty 10

## 2014-04-25 NOTE — ED Notes (Signed)
Pt c/o abscess to R groin area, onset 1 week ago.

## 2014-04-25 NOTE — ED Provider Notes (Signed)
CSN: [id number]     Arrival date & time 04/25/14  [id number] History   First MD Initiated Contact with Patient 04/25/14 1003     Chief Complaint  Patient presents with  . Abscess     (Consider location/radiation/quality/duration/timing/severity/associated sxs/prior Treatment) Patient is a 28 y.o. male presenting with abscess.  Abscess Location:  Ano-genital Ano-genital abscess location:  Pelvis Abscess quality: fluctuance, painful and warmth   Red streaking: no   Duration:  1 week Progression:  Worsening Pain details:    Quality:  Shooting   Severity:  Moderate   Timing:  Constant   Progression:  Worsening Chronicity:  New Relieved by:  Nothing Worsened by:  Draining/squeezing Ineffective treatments:  Draining/squeezing Risk factors: no prior abscess    Carl Watson is a 28 y.o. male who presents to the ED with a tender, raised, swollen area to the right pubic area. It started about a week ago and last night he tried to drain the area and it has gotten worse. He denies fever, chills or other problems.   History reviewed. No pertinent past medical history. History reviewed. No pertinent past surgical history. Family History  Problem Relation Age of Onset  . Diabetes Other    History  Substance Use Topics  . Smoking status: Current Every Day Smoker -- 1.00 packs/day    Types: Cigarettes  . Smokeless tobacco: Never Used  . Alcohol Use: Yes     Comment: weekends/wine and beer    Review of Systems Negative except as stated in HPI   Allergies  Review of patient's allergies indicates no known allergies.  Home Medications   Prior to Admission medications   Medication Sig Start Date End Date Taking? Authorizing Provider  acetaminophen (TYLENOL) 500 MG tablet Take 1,000 mg by mouth every 6 (six) hours as needed. Pain    Historical Provider, MD  HYDROcodone-acetaminophen (NORCO/VICODIN) 5-325 MG per tablet Take 1 tablet by mouth every 4 (four) hours as needed. 04/25/14    Hope Orlene OchM Neese, NP  ibuprofen (ADVIL,MOTRIN) 200 MG tablet Take 400 mg by mouth every 6 (six) hours as needed for pain.    Historical Provider, MD  ondansetron (ZOFRAN ODT) 4 MG disintegrating tablet 4mg  ODT q4 hours prn nausea/vomit Patient not taking: Reported on 04/25/2014 01/19/13   Elwin MochaBlair Walden, MD  sulfamethoxazole-trimethoprim (BACTRIM DS,SEPTRA DS) 800-160 MG per tablet Take 1 tablet by mouth 2 (two) times daily. 04/25/14 05/02/14  Hope Orlene OchM Neese, NP   BP 122/83 mmHg  Pulse 84  Temp(Src) 98.9 F (37.2 C) (Oral)  Resp 18  Ht 5\' 10"  (1.778 m)  Wt 195 lb (88.451 kg)  BMI 27.98 kg/m2  SpO2 96% Physical Exam  Constitutional: He is oriented to person, place, and time. He appears well-developed and well-nourished.  HENT:  Head: Normocephalic.  Eyes: EOM are normal.  Neck: Neck supple.  Cardiovascular: Normal rate.   Pulmonary/Chest: Effort normal.  Abdominal: Soft. There is no tenderness.  Genitourinary:     Tender, raised area right pubic area with increased warmth.   Musculoskeletal: Normal range of motion.  Neurological: He is alert and oriented to person, place, and time. No cranial nerve deficit.  Skin: Skin is warm and dry.  Psychiatric: He has a normal mood and affect. His behavior is normal.  Nursing note and vitals reviewed.   ED Course  INCISION AND DRAINAGE Date/Time: 04/25/2014 10:46 AM Performed by: Janne NapoleonNEESE, HOPE M Authorized by: Janne NapoleonNEESE, HOPE M Consent: Verbal consent obtained. Risks and benefits: risks,  benefits and alternatives were discussed Consent given by: patient Patient understanding: patient states understanding of the procedure being performed Patient consent: the patient's understanding of the procedure matches consent given Required items: required blood products, implants, devices, and special equipment available Patient identity confirmed: verbally with patient Type: abscess Body area: anogenital (pubic right) Anesthesia: local infiltration Local  anesthetic: lidocaine 2% without epinephrine Anesthetic total: 4 ml Patient sedated: no Scalpel size: 11 Needle gauge: 22 Incision type: single straight Complexity: simple Drainage: purulent Drainage amount: moderate Wound treatment: wound left open Patient tolerance: Patient tolerated the procedure well with no immediate complications     MDM  28 y.o. male with abscess to the right pubic area. Drained without difficulty. Stable for discharge without fever or signs of sepsis. Discussed with the patient plan of care and all questioned fully answered. He will return if any problems arise.    Medication List    TAKE these medications        HYDROcodone-acetaminophen 5-325 MG per tablet  Commonly known as:  NORCO/VICODIN  Take 1 tablet by mouth every 4 (four) hours as needed.     sulfamethoxazole-trimethoprim 800-160 MG per tablet  Commonly known as:  BACTRIM DS,SEPTRA DS  Take 1 tablet by mouth 2 (two) times daily.      ASK your doctor about these medications        acetaminophen 500 MG tablet  Commonly known as:  TYLENOL  Take 1,000 mg by mouth every 6 (six) hours as needed. Pain     ibuprofen 200 MG tablet  Commonly known as:  ADVIL,MOTRIN  Take 400 mg by mouth every 6 (six) hours as needed for pain.     ondansetron 4 MG disintegrating tablet  Commonly known as:  ZOFRAN ODT  4mg  ODT q4 hours prn nausea/vomit         Final diagnoses:  Abscess of pubic region     [facility] LPope M Neese, NP 04/25/14 1459  Benny LennertJoseph L Zammit, MD 04/27/14 [phone]

## 2014-04-25 NOTE — ED Notes (Signed)
Carl Watson at bedside. 

## 2014-04-25 NOTE — Discharge Instructions (Signed)
Apply warm wet compresses to the area often. Return for worsening symptoms   Abscess An abscess (boil or furuncle) is an infected area on or under the skin. This area is filled with yellowish-white fluid (pus) and other material (debris). HOME CARE   Only take medicines as told by your doctor.  If you were given antibiotic medicine, take it as directed. Finish the medicine even if you start to feel better.  If gauze is used, follow your doctor's directions for changing the gauze.  To avoid spreading the infection:  Keep your abscess covered with a bandage.  Wash your hands well.  Do not share personal care items, towels, or whirlpools with others.  Avoid skin contact with others.  Keep your skin and clothes clean around the abscess.  Keep all doctor visits as told. GET HELP RIGHT AWAY IF:   You have more pain, puffiness (swelling), or redness in the wound site.  You have more fluid or blood coming from the wound site.  You have muscle aches, chills, or you feel sick.  You have a fever. MAKE SURE YOU:   Understand these instructions.  Will watch your condition.  Will get help right away if you are not doing well or get worse. Document Released: 10/16/2007 Document Revised: 10/29/2011 Document Reviewed: 07/12/2011 [facility] Patient Information 2015 [employer]. This information is not intended to replace advice given to you by your health care provider. Make sure you discuss any questions you have with your health care provider.

## 2014-10-24 ENCOUNTER — Encounter (HOSPITAL_COMMUNITY): Payer: Self-pay | Admitting: *Deleted

## 2014-10-24 ENCOUNTER — Emergency Department (HOSPITAL_COMMUNITY)
Admission: EM | Admit: 2014-10-24 | Discharge: 2014-10-24 | Disposition: A | Discharge status: Home / Self Care | Payer: Self-pay | Attending: Emergency Medicine | Admitting: Emergency Medicine

## 2014-10-24 DIAGNOSIS — G43809 Other migraine, not intractable, without status migrainosus: Secondary | ICD-10-CM | POA: Insufficient documentation

## 2014-10-24 DIAGNOSIS — Z720 Tobacco use: Secondary | ICD-10-CM | POA: Insufficient documentation

## 2014-10-24 MED ORDER — PROCHLORPERAZINE EDISYLATE 5 MG/ML IJ SOLN
10.0000 mg | Freq: Once | INTRAMUSCULAR | Status: AC
  Administered 2014-10-24: 10 mg via INTRAVENOUS

## 2014-10-24 MED ORDER — DIPHENHYDRAMINE HCL 50 MG/ML IJ SOLN
25.0000 mg | Freq: Once | INTRAMUSCULAR | Status: AC
  Administered 2014-10-24: 25 mg via INTRAVENOUS

## 2014-10-24 MED ORDER — KETOROLAC TROMETHAMINE 30 MG/ML IJ SOLN
30.0000 mg | Freq: Once | INTRAMUSCULAR | Status: AC
  Administered 2014-10-24: 30 mg via INTRAVENOUS

## 2014-10-24 MED ORDER — SODIUM CHLORIDE 0.9 % IV BOLUS (SEPSIS)
1000.0000 mL | Freq: Once | INTRAVENOUS | Status: AC
  Administered 2014-10-24: 1000 mL via INTRAVENOUS

## 2014-10-24 NOTE — Discharge Instructions (Signed)

## 2014-10-24 NOTE — ED Notes (Signed)
Pt states he has been dizzy for the past couple of months & has a headache that started yesterday that has not let up.

## 2014-10-24 NOTE — ED Provider Notes (Signed)
CSN: [id number]     Arrival date & time 10/24/14  0047 History   First MD Initiated Contact with Patient 10/24/14 0100     Chief Complaint  Patient presents with  . Dizziness     (Consider location/radiation/quality/duration/timing/severity/associated sxs/prior Treatment) HPI  This is a 29 year old male who presents with headache. Patient reports 2 day history of migraine headache. Reports history of the same. He also states that over the last 3-4 hours he has had increasing dizziness. He describes the dizziness as feeling off balance. He denies any room spinning dizziness. Patient states that the headache starts in the front moves to the back.  Rates current pain at 7 out of 10. Denies any fevers or neck stiffness. Patient took Excedrin Migraine yesterday without relief. He also took 800 mg of ibuprofen today.  History reviewed. No pertinent past medical history. History reviewed. No pertinent past surgical history. Family History  Problem Relation Age of Onset  . Diabetes Other    History  Substance Use Topics  . Smoking status: Current Every Day Smoker -- 1.00 packs/day    Types: Cigarettes  . Smokeless tobacco: Never Used  . Alcohol Use: Yes     Comment: weekends/wine and beer    Review of Systems  Constitutional: Negative.  Negative for fever.  Eyes: Positive for photophobia. Negative for visual disturbance.  Respiratory: Negative.  Negative for chest tightness and shortness of breath.   Cardiovascular: Negative.  Negative for chest pain.  Gastrointestinal: Negative.  Negative for nausea, vomiting and abdominal pain.  Genitourinary: Negative.  Negative for dysuria.  Musculoskeletal: Negative for neck pain and neck stiffness.  Neurological: Positive for dizziness and headaches.  All other systems reviewed and are negative.     Allergies  Review of patient's allergies indicates no known allergies.  Home Medications   Prior to Admission medications   Medication Sig  Start Date End Date Taking? Authorizing Provider  acetaminophen (TYLENOL) 500 MG tablet Take 1,000 mg by mouth every 6 (six) hours as needed. Pain   Yes Historical Provider, MD  ibuprofen (ADVIL,MOTRIN) 200 MG tablet Take 400 mg by mouth every 6 (six) hours as needed for pain.   Yes Historical Provider, MD  HYDROcodone-acetaminophen (NORCO/VICODIN) 5-325 MG per tablet Take 1 tablet by mouth every 4 (four) hours as needed. 04/25/14   Hope Orlene Och, NP  ondansetron (ZOFRAN ODT) 4 MG disintegrating tablet  ODT q4 hours prn nausea/vomit Patient not taking: Reported on 04/25/2014 01/19/13   Elwin Mocha, MD   BP 144/104 mmHg  Pulse 100  Temp(Src) 97.9 F (36.6 C) (Oral)  Resp 20  Ht  (1.778 m)  Wt 183 lb (83.008 kg)  BMI 26.26 kg/m2  SpO2 100% Physical Exam  Constitutional: He is oriented to person, place, and time. He appears well-developed and well-nourished. No distress.  HENT:  Head: Normocephalic and atraumatic.  Eyes: Pupils are equal, round, and reactive to light.  Neck: Normal range of motion.  Cardiovascular: Normal rate, regular rhythm and normal heart sounds.   No murmur heard. Pulmonary/Chest: Effort normal and breath sounds normal. No respiratory distress. He has no wheezes.  Abdominal: Soft. Bowel sounds are normal. There is no tenderness. There is no rebound.  Musculoskeletal: He exhibits no edema.  Neurological: He is alert and oriented to person, place, and time.  5 out of 5 strength in all 4 extremities, normal gait  Skin: Skin is warm and dry.  Psychiatric: He has a normal mood and affect.  Nursing note and vitals reviewed.   ED Course  Procedures (including critical care time) Labs Review Labs Reviewed - No data to display  Imaging Review No results found.   EKG Interpretation   Date/Time:  Monday October 24 2014 01:04:05 EDT Ventricular Rate:  93 PR Interval:  150 QRS Duration: 84 QT Interval:  362 QTC Calculation: 450 R Axis:   56 Text  Interpretation:  Age not entered, assumed to be  29 years old for  purpose of ECG interpretation Sinus rhythm NO prior for comparison  Confirmed by Darcia Lampi  MD, Andreya Lacks ([id number]) on 10/24/2014 2:27:31 AM      MDM   Final diagnoses:  Other migraine without status migrainosus, not intractable   Patient presents with headache and dizziness. Nontoxic on exam. Nonfocal. History of migraines.  Doubt subarachnoid hemorrhage or meningitis. Patient did increase heart rate from lying to sitting by 30. May be dehydrated causing him dizziness. Patient given fluids, and headache cocktail. EKG is reassuring. On recheck, patient is resting comfortably. Continues to be nonfocal. Reports improvement of pain. Able to ambulate at discharge.  After history, exam, and medical workup I feel the patient has been appropriately medically screened and is safe for discharge home. Pertinent diagnoses were discussed with the patient. Patient was given return precautions.     Shon Baton, MD 10/24/14 [phone]

## 2015-03-13 ENCOUNTER — Emergency Department (HOSPITAL_COMMUNITY): Payer: Self-pay

## 2015-03-13 ENCOUNTER — Emergency Department (HOSPITAL_COMMUNITY)
Admission: EM | Admit: 2015-03-13 | Discharge: 2015-03-13 | Disposition: A | Discharge status: Home / Self Care | Payer: Self-pay | Attending: Emergency Medicine | Admitting: Emergency Medicine

## 2015-03-13 ENCOUNTER — Encounter (HOSPITAL_COMMUNITY): Payer: Self-pay | Admitting: Emergency Medicine

## 2015-03-13 DIAGNOSIS — J069 Acute upper respiratory infection, unspecified: Secondary | ICD-10-CM | POA: Insufficient documentation

## 2015-03-13 DIAGNOSIS — Z720 Tobacco use: Secondary | ICD-10-CM | POA: Insufficient documentation

## 2015-03-13 DIAGNOSIS — J209 Acute bronchitis, unspecified: Secondary | ICD-10-CM | POA: Insufficient documentation

## 2015-03-13 DIAGNOSIS — M549 Dorsalgia, unspecified: Secondary | ICD-10-CM | POA: Insufficient documentation

## 2015-03-13 MED ORDER — HYDROCOD POLST-CPM POLST ER 10-8 MG/5ML PO SUER
5.0000 mL | Freq: Once | ORAL | Status: AC
  Administered 2015-03-13: 5 mL via ORAL
  Filled 2015-03-13: qty 5

## 2015-03-13 MED ORDER — OXYMETAZOLINE HCL 0.05 % NA SOLN
2.0000 | Freq: Once | NASAL | Status: AC
  Administered 2015-03-13: 2 via NASAL
  Filled 2015-03-13: qty 15

## 2015-03-13 MED ORDER — ALBUTEROL SULFATE HFA 108 (90 BASE) MCG/ACT IN AERS
2.0000 | INHALATION_SPRAY | Freq: Once | RESPIRATORY_TRACT | Status: AC
  Administered 2015-03-13: 2 via RESPIRATORY_TRACT
  Filled 2015-03-13: qty 6.7

## 2015-03-13 MED ORDER — IPRATROPIUM-ALBUTEROL 0.5-2.5 (3) MG/3ML IN SOLN
3.0000 mL | Freq: Once | RESPIRATORY_TRACT | Status: AC
  Administered 2015-03-13: 3 mL via RESPIRATORY_TRACT
  Filled 2015-03-13: qty 3

## 2015-03-13 MED ORDER — DEXAMETHASONE 4 MG PO TABS: 4.0000 mg | ORAL_TABLET | Freq: Two times a day (BID) | ORAL | Status: DC

## 2015-03-13 MED ORDER — PROMETHAZINE-CODEINE 6.25-10 MG/5ML PO SYRP: ORAL_SOLUTION | ORAL | Status: DC

## 2015-03-13 MED ORDER — PREDNISONE 50 MG PO TABS
60.0000 mg | ORAL_TABLET | Freq: Once | ORAL | Status: AC
  Administered 2015-03-13: 60 mg via ORAL
  Filled 2015-03-13 (×2): qty 1

## 2015-03-13 NOTE — ED Provider Notes (Signed)
CSN: [id number]     Arrival date & time 03/13/15  1348 History  By signing my name below, I, Gwenyth Ober, attest that this documentation has been prepared under the direction and in the presence of Ivery Quale, PA-C.  Electronically Signed: Gwenyth Ober, ED Scribe. 03/13/2015. 3:55 PM.  Chief Complaint  Patient presents with  . Cough  . Nasal Congestion  . Back Pain   Patient is a 29 y.o. male presenting with cough. The history is provided by the patient. No language interpreter was used.  Cough Cough characteristics:  Productive Severity:  Moderate Onset quality:  Gradual Duration:  6 days Timing:  Intermittent Chronicity:  New Smoker: yes   Associated symptoms: fever   Associated symptoms: no rash     HPI Comments: Carl Watson is a 29 y.o. male who presents to the Emergency Department complaining of intermittent, moderate, productive cough that started 6 days ago.  He states back pain that occurs with deep breathing, subjective fever, night sweats and voice change as associated symptoms. Pt smokes cigarettes. He denies hemoptysis, diarrhea and rash.   History reviewed. No pertinent past medical history. History reviewed. No pertinent past surgical history. Family History  Problem Relation Age of Onset  . Diabetes Other    Social History  Substance Use Topics  . Smoking status: Current Every Day Smoker -- 1.00 packs/day    Types: Cigarettes  . Smokeless tobacco: Never Used  . Alcohol Use: Yes     Comment: weekends/wine and beer    Review of Systems  Constitutional: Positive for fever.  HENT: Positive for voice change.   Respiratory: Positive for cough.   Gastrointestinal: Negative for diarrhea.  Musculoskeletal: Positive for back pain.  Skin: Negative for rash.  All other systems reviewed and are negative.  Allergies  Review of patient's allergies indicates no known allergies.  Home Medications   Prior to Admission medications   Medication Sig  Start Date End Date Taking? Authorizing Provider  acetaminophen (TYLENOL) 500 MG tablet Take 1,000 mg by mouth every 6 (six) hours as needed. Pain    Historical Provider, MD  HYDROcodone-acetaminophen (NORCO/VICODIN) 5-325 MG per tablet Take 1 tablet by mouth every 4 (four) hours as needed. 04/25/14   Hope Orlene Och, NP  ibuprofen (ADVIL,MOTRIN) 200 MG tablet Take 400 mg by mouth every 6 (six) hours as needed for pain.    Historical Provider, MD   BP 145/93 mmHg  Pulse 108  Temp(Src) 98 F (36.7 C) (Oral)  Resp 18  Ht  (1.753 m)  Wt 185 lb (83.915 kg)  BMI 27.31 kg/m2  SpO2 96% Physical Exam  Constitutional: He appears well-developed and well-nourished. No distress.  HENT:  Head: Normocephalic and atraumatic.  Moderate erythema of the posterior oropharynx Uvula enlarged, but midline  Eyes: Conjunctivae and EOM are normal.  Neck: Normal range of motion. Neck supple. No tracheal deviation present.  Cardiovascular: Normal rate.   Pulmonary/Chest: Effort normal. No respiratory distress.  Bilateral inspiratory and expiratory wheeze Scattered rhonchi Symmetrical rise and fall of the chest  Musculoskeletal: He exhibits no edema.  Capillary refill <2 s Negative Homan's sign No edema of bilateral LE  Lymphadenopathy:    He has no cervical adenopathy.  Skin: Skin is warm and dry.  Psychiatric: He has a normal mood and affect. His behavior is normal.  Nursing note and vitals reviewed.   ED Course  Discussed the importance of stop smoking with the patient. Pt acknowledges unstanding of information  given.  Procedures  DIAGNOSTIC STUDIES: Oxygen Saturation is 96% on RA, normal by my interpretation.    COORDINATION OF CARE: 3:54 PM Discussed treatment plan with pt which includes prednisone and an at-home inhaler. Discussed smoking cessation with pt. He agreed to plan.  Labs Review Labs Reviewed - No data to display  Imaging Review Dg Chest 2 View  03/13/2015  CLINICAL DATA:   Initial encounter for 1 week history of intermittent cough and fever. EXAM: CHEST  2 VIEW COMPARISON:  12/09/2012 FINDINGS: The heart size and mediastinal contours are within normal limits. Both lungs are clear. The visualized skeletal structures are unremarkable. IMPRESSION: No active cardiopulmonary disease. Electronically Signed   By: Kennith CenterEric  Mansell M.D.   On: 03/13/2015 14:27   I have personally reviewed and evaluated these images as part of my medical decision-making.   EKG Interpretation None      MDM The chest x-ray showed no active cardiopulmonary disease. The patient speaks in complete sentences. His pulse oximetries were 92-93 and at times up to 96%. The patient is treated with albuterol, Decadron, and Phenergan codeine cough medication. The patient is to return to the emergency department if any changes, problems, concerns, or signs of worsening of his condition.    Final diagnoses:  None    **I have reviewed nursing notes, vital signs, and all appropriate lab and imaging results for this patient.*  **I personally performed the services described in this documentation, which was scribed in my presence. The recorded information has been reviewed and is accurate.Ivery Quale*   Diesel Lina, PA-C 03/13/15 2144  Glynn OctaveStephen Rancour, MD 03/14/15 Marlyne Beards0002

## 2015-03-13 NOTE — ED Notes (Signed)
Having cold symptoms for last week.  Treated with  OTC meds without relief.

## 2015-03-13 NOTE — Discharge Instructions (Signed)
Your examination is consistent with acute bronchitis and upper respiratory infection. Please use 2 squirts of Afrin spray 3 times daily in each nostril for 5 days only. Please use 2 puffs albuterol every 4 hours. Decadron 2 times daily with food. May use promethazine codeine cough medication every 6 hours. This medication may cause drowsiness, please use with caution. Acute Bronchitis Bronchitis is when the airways that extend from the windpipe into the lungs get red, puffy, and painful (inflamed). Bronchitis often causes thick spit (mucus) to develop. This leads to a cough. A cough is the most common symptom of bronchitis. In acute bronchitis, the condition usually begins suddenly and goes away over time (usually in 2 weeks). Smoking, allergies, and asthma can make bronchitis worse. Repeated episodes of bronchitis may cause more lung problems. HOME CARE  Rest.  Drink enough fluids to keep your pee (urine) clear or pale yellow (unless you need to limit fluids as told by your doctor).  Only take over-the-counter or prescription medicines as told by your doctor.  Avoid smoking and secondhand smoke. These can make bronchitis worse. If you are a smoker, think about using nicotine gum or skin patches. Quitting smoking will help your lungs heal faster.  Reduce the chance of getting bronchitis again by:  Washing your hands often.  Avoiding people with cold symptoms.  Trying not to touch your hands to your mouth, nose, or eyes.  Follow up with your doctor as told. GET HELP IF: Your symptoms do not improve after 1 week of treatment. Symptoms include:  Cough.  Fever.  Coughing up thick spit.  Body aches.  Chest congestion.  Chills.  Shortness of breath.  Sore throat. GET HELP RIGHT AWAY IF:   You have an increased fever.  You have chills.  You have severe shortness of breath.  You have bloody thick spit (sputum).  You throw up (vomit) often.  You lose too much body fluid  (dehydration).  You have a severe headache.  You faint. MAKE SURE YOU:   Understand these instructions.  Will watch your condition.  Will get help right away if you are not doing well or get worse.   This information is not intended to replace advice given to you by your health care provider. Make sure you discuss any questions you have with your health care provider.   Document Released: 10/16/2007 Document Revised: 12/30/2012 Document Reviewed: 10/20/2012 Elsevier Interactive Patient Education [employer]2016 [employer] Inc.

## 2016-01-16 ENCOUNTER — Emergency Department (HOSPITAL_COMMUNITY): Payer: Self-pay

## 2016-01-16 ENCOUNTER — Emergency Department (HOSPITAL_COMMUNITY)
Admission: EM | Admit: 2016-01-16 | Discharge: 2016-01-16 | Disposition: A | Discharge status: Home / Self Care | Payer: Self-pay | Attending: Emergency Medicine | Admitting: Emergency Medicine

## 2016-01-16 ENCOUNTER — Encounter (HOSPITAL_COMMUNITY): Payer: Self-pay | Admitting: Emergency Medicine

## 2016-01-16 DIAGNOSIS — Y92009 Unspecified place in unspecified non-institutional (private) residence as the place of occurrence of the external cause: Secondary | ICD-10-CM | POA: Insufficient documentation

## 2016-01-16 DIAGNOSIS — F17210 Nicotine dependence, cigarettes, uncomplicated: Secondary | ICD-10-CM | POA: Insufficient documentation

## 2016-01-16 DIAGNOSIS — W11XXXA Fall on and from ladder, initial encounter: Secondary | ICD-10-CM | POA: Insufficient documentation

## 2016-01-16 DIAGNOSIS — S63502A Unspecified sprain of left wrist, initial encounter: Secondary | ICD-10-CM | POA: Insufficient documentation

## 2016-01-16 DIAGNOSIS — Y999 Unspecified external cause status: Secondary | ICD-10-CM | POA: Insufficient documentation

## 2016-01-16 DIAGNOSIS — Y9389 Activity, other specified: Secondary | ICD-10-CM | POA: Insufficient documentation

## 2016-01-16 MED ORDER — HYDROCODONE-ACETAMINOPHEN 5-325 MG PO TABS
1.0000 | ORAL_TABLET | Freq: Once | ORAL | Status: AC
  Administered 2016-01-16: 1 via ORAL
  Filled 2016-01-16: qty 1

## 2016-01-16 MED ORDER — TRAMADOL HCL 50 MG PO TABS: 50.0000 mg | ORAL_TABLET | Freq: Four times a day (QID) | ORAL | 0 refills | Status: DC | PRN

## 2016-01-16 NOTE — ED Triage Notes (Signed)
Pt reports falling off a 4 ft ladder yesterday onto LT arm. Pt c/o LT wrist pain. No obvious edema or deformity.

## 2016-01-16 NOTE — ED Provider Notes (Signed)
[facility]-EMERGENCY DEPT Provider Note   CSN: [id number] Arrival date & time: 01/16/16  4098  By signing my name below, I, Majel Homer, attest that this documentation has been prepared under the direction and in the presence of Rolland Porter, MD . Electronically Signed: Majel Homer, Scribe. 01/16/2016. 8:33 AM.  History   Chief Complaint Chief Complaint  Patient presents with  . Wrist Injury   The history is provided by the patient. No language interpreter was used.    HPI Comments: Carl Watson is a 30 y.o. male who presents to the Emergency Department complaining of gradually worsening, left wrist pain s/p a fall that occurred yesterday afternoon. Pt reports he was helping his mom at her house yesterday when he fell off a 4 foot ladder onto all four of his extremities. He states most of his pain is centralized around his left wrist as he placed more weight on it to brace his fall. He denies hitting his head or losing consciousness. He notes he has applied ice with no relief.    History reviewed. No pertinent past medical history.  There are no active problems to display for this patient.  History reviewed. No pertinent surgical history.   Home Medications    Prior to Admission medications   Medication Sig Start Date End Date Taking? Authorizing Provider  acetaminophen (TYLENOL) 500 MG tablet Take 1,000 mg by mouth every 6 (six) hours as needed. Pain    Historical Provider, MD  dexamethasone (DECADRON) 4 MG tablet Take 1 tablet (4 mg total) by mouth 2 (two) times daily with a meal. 03/13/15   Ivery Quale, PA-C  ibuprofen (ADVIL,MOTRIN) 200 MG tablet Take 400 mg by mouth every 6 (six) hours as needed for pain.    Historical Provider, MD  promethazine-codeine [facility] WITH CODEINE) 6.25-10 MG/5ML syrup 1 or 2 tsp q6h prn cough 03/13/15   Ivery Quale, PA-C  Pseudoephedrine-APAP-DM (DAYQUIL PO) Take 2 capsules by mouth daily.    Historical Provider, MD  traMADol (ULTRAM) 50 MG tablet Take  1 tablet (50 mg total) by mouth every 6 (six) hours as needed for moderate pain. 01/16/16   Rolland Porter, MD    Family History Family History  Problem Relation Age of Onset  . Diabetes Other     Social History Social History  Substance Use Topics  . Smoking status: Current Every Day Smoker    Packs/day: 1.00    Types: Cigarettes  . Smokeless tobacco: Never Used  . Alcohol use Yes     Comment: weekends/wine and beer     Allergies   Review of patient's allergies indicates no known allergies.   Review of Systems Review of Systems  Constitutional: Negative for appetite change, chills, diaphoresis, fatigue and fever.  HENT: Negative for mouth sores, sore throat and trouble swallowing.   Eyes: Negative for visual disturbance.  Respiratory: Negative for cough, chest tightness, shortness of breath and wheezing.   Cardiovascular: Negative for chest pain.  Gastrointestinal: Negative for abdominal distention, abdominal pain, diarrhea, nausea and vomiting.  Endocrine: Negative for polydipsia, polyphagia and polyuria.  Genitourinary: Negative for dysuria, frequency and hematuria.  Musculoskeletal: Positive for arthralgias. Negative for gait problem.  Skin: Negative for color change, pallor and rash.  Neurological: Negative for dizziness, syncope, light-headedness and headaches.  Hematological: Does not bruise/bleed easily.  Psychiatric/Behavioral: Negative for behavioral problems and confusion.   Physical Exam Updated Vital Signs BP 145/69 (BP Location: Right Arm)   Pulse 77   Temp 97.5 F (  36.4 C) (Oral)   Resp 18   SpO2 100%   Physical Exam  Constitutional: He is oriented to person, place, and time. He appears well-developed and well-nourished. No distress.  HENT:  Head: Normocephalic.  Eyes: Conjunctivae are normal. Pupils are equal, round, and reactive to light. No scleral icterus.  Neck: Normal range of motion. Neck supple. No thyromegaly present.  Cardiovascular: Normal  rate and regular rhythm.  Exam reveals no gallop and no friction rub.   No murmur heard. Pulmonary/Chest: Effort normal and breath sounds normal. No respiratory distress. He has no wheezes. He has no rales.  Abdominal: Soft. Bowel sounds are normal. He exhibits no distension. There is no tenderness. There is no rebound.  Musculoskeletal: Normal range of motion.  Left wrist: Pain and tenderness to left wrist at snuff box and soft tissue swelling  Neurological: He is alert and oriented to person, place, and time.  Skin: Skin is warm and dry. No rash noted.  Psychiatric: He has a normal mood and affect. His behavior is normal.   ED Treatments / Results  Labs (all labs ordered are listed, but only abnormal results are displayed) Labs Reviewed - No data to display  EKG  EKG Interpretation None       Radiology No results found. Procedures Procedures  DIAGNOSTIC STUDIES:  Oxygen Saturation is 100% on RA, normal by my interpretation.    COORDINATION OF CARE:  8:31 AM Discussed treatment plan with pt at bedside and pt agreed to plan.  Medications Ordered in ED Medications  HYDROcodone-acetaminophen (NORCO/VICODIN) 5-325 MG per tablet 1 tablet (1 tablet Oral Given 01/16/16 0851)     Initial Impression / Assessment and Plan / ED Course  I have reviewed the triage vital signs and the nursing notes.  Pertinent labs & imaging results that were available during my care of the patient were reviewed by me and considered in my medical decision making (see chart for details).  Clinical Course      I personally performed the services described in this documentation, which was scribed in my presence. The recorded information has been reviewed and is accurate.   Final Clinical Impressions(s) / ED Diagnoses   Final diagnoses:  Wrist sprain, left, initial encounter    New Prescriptions Discharge Medication List as of 01/16/2016  9:00 AM    START taking these medications   Details    traMADol (ULTRAM) 50 MG tablet Take 1 tablet (50 mg total) by mouth every 6 (six) hours as needed for moderate pain., Starting Tue 01/16/2016, Print         Rolland PorterMark Aaban Griep, MD 01/23/16 [phone]

## 2016-10-22 ENCOUNTER — Emergency Department (HOSPITAL_COMMUNITY): Payer: Self-pay

## 2016-10-22 ENCOUNTER — Encounter (HOSPITAL_COMMUNITY): Payer: Self-pay

## 2016-10-22 ENCOUNTER — Emergency Department (HOSPITAL_COMMUNITY)
Admission: EM | Admit: 2016-10-22 | Discharge: 2016-10-22 | Disposition: A | Discharge status: Home / Self Care | Payer: Self-pay | Attending: Emergency Medicine | Admitting: Emergency Medicine

## 2016-10-22 DIAGNOSIS — J069 Acute upper respiratory infection, unspecified: Secondary | ICD-10-CM | POA: Insufficient documentation

## 2016-10-22 DIAGNOSIS — J209 Acute bronchitis, unspecified: Secondary | ICD-10-CM | POA: Insufficient documentation

## 2016-10-22 DIAGNOSIS — Z79899 Other long term (current) drug therapy: Secondary | ICD-10-CM | POA: Insufficient documentation

## 2016-10-22 DIAGNOSIS — F17210 Nicotine dependence, cigarettes, uncomplicated: Secondary | ICD-10-CM | POA: Insufficient documentation

## 2016-10-22 LAB — RAPID STREP SCREEN (MED CTR MEBANE ONLY): Streptococcus, Group A Screen (Direct): NEGATIVE

## 2016-10-22 MED ORDER — IPRATROPIUM-ALBUTEROL 0.5-2.5 (3) MG/3ML IN SOLN
3.0000 mL | Freq: Once | RESPIRATORY_TRACT | Status: AC
  Administered 2016-10-22: 3 mL via RESPIRATORY_TRACT
  Filled 2016-10-22: qty 3

## 2016-10-22 MED ORDER — PREDNISONE 20 MG PO TABS: 60.0000 mg | ORAL_TABLET | Freq: Every day | ORAL | 0 refills | Status: DC

## 2016-10-22 MED ORDER — PREDNISONE 50 MG PO TABS
60.0000 mg | ORAL_TABLET | Freq: Once | ORAL | Status: AC
  Administered 2016-10-22: 60 mg via ORAL
  Filled 2016-10-22: qty 1

## 2016-10-22 MED ORDER — ALBUTEROL SULFATE HFA 108 (90 BASE) MCG/ACT IN AERS
2.0000 | INHALATION_SPRAY | RESPIRATORY_TRACT | Status: DC | PRN
  Administered 2016-10-22: 2 via RESPIRATORY_TRACT
  Filled 2016-10-22: qty 6.7

## 2016-10-22 NOTE — ED Notes (Signed)
ED Provider at bedside. 

## 2016-10-22 NOTE — ED Provider Notes (Signed)
[facility]-EMERGENCY DEPT Provider Note   CSN: [id number] Arrival date & time: 10/22/16  0359     History   Chief Complaint Chief Complaint  Patient presents with  . Cough  . Nasal Congestion    HPI Carl Watson is a 31 y.o. male.  The history is provided by the patient.  He has been sick for 3 days. He started having a scratchy throat which has gotten progressively worse. Over the last 2 days, he has developed nasal congestion and cough and dyspnea. Cough is productive of a small amount of white to slightly yellow sputum. Rhinorrhea has been mostly clear. He denies fever, chills, sweats. He denies arthralgias or myalgias. He has had difficulty sleeping because of cough and nasal congestion. He has tried over-the-counter cough medication without any relief. Of note, he does smoke 1 pack of cigarettes a day. He denies any sick contacts.  History reviewed. No pertinent past medical history.  There are no active problems to display for this patient.   History reviewed. No pertinent surgical history.     Home Medications    Prior to Admission medications   Medication Sig Start Date End Date Taking? Authorizing Provider  acetaminophen (TYLENOL) 500 MG tablet Take 1,000 mg by mouth every 6 (six) hours as needed. Pain    [provider]  dexamethasone (DECADRON) 4 MG tablet Take 1 tablet (4 mg total) by mouth 2 (two) times daily with a meal. 03/13/15   Ivery QualeBryant, Hobson, PA-C  ibuprofen (ADVIL,MOTRIN) 200 MG tablet Take 400 mg by mouth every 6 (six) hours as needed for pain.    [provider]  promethazine-codeine (PHENERGAN WITH CODEINE) 6.25-10 MG/5ML syrup 1 or 2 tsp q6h prn cough 03/13/15   Ivery QualeBryant, Hobson, PA-C  Pseudoephedrine-APAP-DM (DAYQUIL PO) Take 2 capsules by mouth daily.    [provider]  traMADol (ULTRAM) 50 MG tablet Take 1 tablet (50 mg total) by mouth every 6 (six) hours as needed for moderate pain. 01/16/16   Rolland PorterJames, Mark, MD    Family  History Family History  Problem Relation Age of Onset  . Diabetes Other     Social History Social History  Substance Use Topics  . Smoking status: Current Every Day Smoker    Packs/day: 1.00    Types: Cigarettes  . Smokeless tobacco: Never Used  . Alcohol use Yes     Comment: weekends/wine and beer     Allergies   Patient has no known allergies.   Review of Systems Review of Systems  All other systems reviewed and are negative.    Physical Exam Updated Vital Signs BP (!) 143/97   Pulse 80   Temp 98 F (36.7 C)   Resp 18   Ht 5\' 9"  (1.753 m)   Wt 80.7 kg (178 lb)   SpO2 98%   BMI 26.29 kg/m   Physical Exam  Nursing note and vitals reviewed.  31 year old male, resting comfortably and in no acute distress. Vital signs are significant for hypertension. Oxygen saturation is 98%, which is normal. Head is normocephalic and atraumatic. PERRLA, EOMI. Oropharynx is clear. Mild edema is noted of the turbinates on the right side. No significant nasal drainage. Neck is nontender and supple without adenopathy or JVD. Back is nontender and there is no CVA tenderness. Lungs have mild to moderate inspiratory and expiratory wheezes diffusely without rales or rhonchi. Chest is nontender. Heart has regular rate and rhythm without murmur. Abdomen is soft, flat, nontender without  masses or hepatosplenomegaly and peristalsis is normoactive. Extremities have no cyanosis or edema, full range of motion is present. Skin is warm and dry without rash. Neurologic: Mental status is normal, cranial nerves are intact, there are no motor or sensory deficits.  ED Treatments / Results  Labs (all labs ordered are listed, but only abnormal results are displayed) Labs Reviewed  RAPID STREP SCREEN (NOT AT [facility])  CULTURE, GROUP A STREP [facility])   Radiology Dg Chest 2 View  Result Date: 10/22/2016 CLINICAL DATA:  Acute onset of shortness of breath and productive cough. Initial encounter. EXAM:  CHEST  2 VIEW COMPARISON:  Chest radiograph performed 03/13/2015 FINDINGS: The lungs are well-aerated. Mild peribronchial thickening may reflect viral or small airways disease. There is no evidence of focal opacification, pleural effusion or pneumothorax. The heart is normal in size; the mediastinal contour is within normal limits. No acute osseous abnormalities are seen. IMPRESSION: Mild peribronchial thickening may reflect viral or small airways disease; no evidence of focal airspace consolidation. Electronically Signed   By: Roanna Raider M.D.   On: 10/22/2016 05:34    Procedures Procedures (including critical care time)  Medications Ordered in ED Medications  albuterol (PROVENTIL HFA;VENTOLIN HFA) 108 (90 Base) MCG/ACT inhaler 2 puff (not administered)  ipratropium-albuterol (DUONEB) 0.5-2.5 (3) MG/3ML nebulizer solution 3 mL (3 mLs Nebulization Given 10/22/16 0501)  predniSONE (DELTASONE) tablet 60 mg (60 mg Oral Given 10/22/16 0500)  ipratropium-albuterol (DUONEB) 0.5-2.5 (3) MG/3ML nebulizer solution 3 mL (3 mLs Nebulization Given 10/22/16 1610)     Initial Impression / Assessment and Plan / ED Course  I have reviewed the triage vital signs and the nursing notes.  Pertinent labs & imaging results that were available during my care of the patient were reviewed by me and considered in my medical decision making (see chart for details).  Cough with wheezing-probable bronchitis. Chest x-ray will be obtained to rule out pneumonia. Old records are reviewed, and he does have a prior ED visit for bronchitis. He will be given albuterol with ipratropium via nebulizer, and a dose of oral prednisone.  Chest x-ray shows no pneumonia. Strep screen is negative, culture pending. Following albuterol with ipratropium by nebulizer, he was much improved, but still had some residual wheezing. He was given a second nebulizer treatment with further improvement. At this point, lungs are completely clear. He is  given an albuterol inhaler to take home and given a prescription for prednisone. He is also given Engineer, building services. Encouraged to stop smoking.  Final Clinical Impressions(s) / ED Diagnoses   Final diagnoses:  Acute bronchitis, unspecified organism  Viral upper respiratory tract infection    New Prescriptions New Prescriptions   PREDNISONE (DELTASONE) 20 MG TABLET    Take 3 tablets (60 mg total) by mouth daily.     Dione Booze, MD 10/22/16 [phone]

## 2016-10-22 NOTE — ED Triage Notes (Addendum)
Pt reports cough and congestion with scratchy throat for 3-4 days that has gotten worse. 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

## 2016-10-25 LAB — CULTURE, GROUP A STREP (THRC)

## 2018-10-11 ENCOUNTER — Emergency Department (HOSPITAL_COMMUNITY)
Admission: EM | Admit: 2018-10-11 | Discharge: 2018-10-11 | Disposition: A | Discharge status: Home / Self Care | Payer: Self-pay | Attending: Emergency Medicine | Admitting: Emergency Medicine

## 2018-10-11 ENCOUNTER — Encounter (HOSPITAL_COMMUNITY): Payer: Self-pay | Admitting: *Deleted

## 2018-10-11 ENCOUNTER — Other Ambulatory Visit: Payer: Self-pay

## 2018-10-11 DIAGNOSIS — R197 Diarrhea, unspecified: Secondary | ICD-10-CM | POA: Insufficient documentation

## 2018-10-11 DIAGNOSIS — F17210 Nicotine dependence, cigarettes, uncomplicated: Secondary | ICD-10-CM | POA: Insufficient documentation

## 2018-10-11 DIAGNOSIS — R112 Nausea with vomiting, unspecified: Secondary | ICD-10-CM | POA: Insufficient documentation

## 2018-10-11 DIAGNOSIS — Z79899 Other long term (current) drug therapy: Secondary | ICD-10-CM | POA: Insufficient documentation

## 2018-10-11 LAB — URINALYSIS, ROUTINE W REFLEX MICROSCOPIC
Bilirubin Urine: NEGATIVE
Glucose, UA: NEGATIVE mg/dL
Hgb urine dipstick: NEGATIVE
Ketones, ur: NEGATIVE mg/dL
Leukocytes,Ua: NEGATIVE
Nitrite: NEGATIVE
Protein, ur: NEGATIVE mg/dL
Specific Gravity, Urine: 1.01 (ref 1.005–1.030)
pH: 8 (ref 5.0–8.0)

## 2018-10-11 LAB — CBC WITH DIFFERENTIAL/PLATELET
Abs Immature Granulocytes: 0.02 10*3/uL (ref 0.00–0.07)
Basophils Absolute: 0 10*3/uL (ref 0.0–0.1)
Basophils Relative: 0 %
Eosinophils Absolute: 0 10*3/uL (ref 0.0–0.5)
Eosinophils Relative: 0 %
HCT: 51.3 % (ref 39.0–52.0)
Hemoglobin: 17.6 g/dL — ABNORMAL HIGH (ref 13.0–17.0)
Immature Granulocytes: 0 %
Lymphocytes Relative: 13 %
Lymphs Abs: 1.1 10*3/uL (ref 0.7–4.0)
MCH: 31.4 pg (ref 26.0–34.0)
MCHC: 34.3 g/dL (ref 30.0–36.0)
MCV: 91.6 fL (ref 80.0–100.0)
Monocytes Absolute: 0.6 10*3/uL (ref 0.1–1.0)
Monocytes Relative: 7 %
Neutro Abs: 6.6 10*3/uL (ref 1.7–7.7)
Neutrophils Relative %: 80 %
Platelets: 230 10*3/uL (ref 150–400)
RBC: 5.6 MIL/uL (ref 4.22–5.81)
RDW: 11.9 % (ref 11.5–15.5)
WBC: 8.5 10*3/uL (ref 4.0–10.5)
nRBC: 0 % (ref 0.0–0.2)

## 2018-10-11 LAB — COMPREHENSIVE METABOLIC PANEL
ALT: 22 U/L (ref 0–44)
AST: 24 U/L (ref 15–41)
Albumin: 4.4 g/dL (ref 3.5–5.0)
Alkaline Phosphatase: 80 U/L (ref 38–126)
Anion gap: 11 (ref 5–15)
BUN: 9 mg/dL (ref 6–20)
CO2: 22 mmol/L (ref 22–32)
Calcium: 9.7 mg/dL (ref 8.9–10.3)
Chloride: 104 mmol/L (ref 98–111)
Creatinine, Ser: 1.02 mg/dL (ref 0.61–1.24)
GFR calc Af Amer: >60 mL/min (ref >60)
GFR calc non Af Amer: >60 mL/min (ref >60)
Glucose, Bld: 103 mg/dL — ABNORMAL HIGH (ref 70–99)
Potassium: 4.7 mmol/L (ref 3.5–5.1)
Sodium: 137 mmol/L (ref 135–145)
Total Bilirubin: 1.5 mg/dL — ABNORMAL HIGH (ref 0.3–1.2)
Total Protein: 7.9 g/dL (ref 6.5–8.1)

## 2018-10-11 LAB — MAGNESIUM: Magnesium: 2.3 mg/dL (ref 1.7–2.4)

## 2018-10-11 LAB — LIPASE, BLOOD: Lipase: 24 U/L (ref 11–51)

## 2018-10-11 MED ORDER — ONDANSETRON HCL 4 MG PO TABS: 4.0000 mg | ORAL_TABLET | Freq: Three times a day (TID) | ORAL | 0 refills | Status: DC | PRN

## 2018-10-11 MED ORDER — ONDANSETRON HCL 4 MG/2ML IJ SOLN
4.0000 mg | Freq: Once | INTRAMUSCULAR | Status: AC
  Administered 2018-10-11: 4 mg via INTRAVENOUS
  Filled 2018-10-11: qty 2

## 2018-10-11 MED ORDER — SODIUM CHLORIDE 0.9 % IV BOLUS (SEPSIS)
1000.0000 mL | Freq: Once | INTRAVENOUS | Status: AC
  Administered 2018-10-11: 1000 mL via INTRAVENOUS

## 2018-10-11 NOTE — Discharge Instructions (Signed)
Thank you for allowing me to care for you today. Please return to the emergency department if you have new or worsening symptoms. Take your medications as instructed.  ° °

## 2018-10-11 NOTE — ED Provider Notes (Signed)
[facility] EMERGENCY DEPARTMENT Provider Note   CSN: [id number] Arrival date & time: 10/11/18  1903    History   Chief Complaint Chief Complaint  Patient presents with  . Emesis  . Diarrhea    HPI Lorraine Laxristan J Ports is a 33 y.o. male.     33 y/o male with no past medical history presenting to the emergency department for abdominal pain, nausea vomiting diarrhea since Tuesday.  Patient reports that he felt like he was coming down with a virus Tuesday morning.  Reports that he had diarrhea and vomiting on Tuesday.  Reports that he last had an episode of diarrhea on Wednesday but has not had a bowel movement since then.  Reports that every time he tries to eat something he gets nauseated and vomits.  He currently denies any abdominal pain, dysuria, hematuria, melena.  He admits to alcohol use occasionally as well as smoking cigarettes and marijuana.  He reports trying Pepto-Bismol but this was not helpful.     History reviewed. No pertinent past medical history.  There are no active problems to display for this patient.   History reviewed. No pertinent surgical history.      Home Medications    Prior to Admission medications   Medication Sig Start Date End Date Taking? Authorizing Provider  acetaminophen (TYLENOL) 500 MG tablet Take 1,000 mg by mouth every 6 (six) hours as needed. Pain   Yes [provider]  ibuprofen (ADVIL,MOTRIN) 200 MG tablet Take 400 mg by mouth every 6 (six) hours as needed for pain.   Yes [provider]  dexamethasone (DECADRON) 4 MG tablet Take 1 tablet (4 mg total) by mouth 2 (two) times daily with a meal. 03/13/15   Ivery QualeBryant, Hobson, PA-C  ondansetron (ZOFRAN) 4 MG tablet Take 1 tablet (4 mg total) by mouth every 8 (eight) hours as needed for nausea or vomiting. 10/11/18   Arlyn DunningMcLean, Denya Buckingham A, PA-C  predniSONE (DELTASONE) 20 MG tablet Take 3 tablets (60 mg total) by mouth daily. 10/22/16   Dione BoozeGlick, David, MD  promethazine-codeine [facility](PHENERGAN  WITH CODEINE) 6.25-10 MG/5ML syrup 1 or 2 tsp q6h prn cough 03/13/15   Ivery QualeBryant, Hobson, PA-C  Pseudoephedrine-APAP-DM (DAYQUIL PO) Take 2 capsules by mouth daily.    [provider]  traMADol (ULTRAM) 50 MG tablet Take 1 tablet (50 mg total) by mouth every 6 (six) hours as needed for moderate pain. 01/16/16   Rolland PorterJames, Mark, MD    Family History Family History  Problem Relation Age of Onset  . Diabetes Other     Social History Social History   Tobacco Use  . Smoking status: Current Every Day Smoker    Packs/day: 1.00    Types: Cigarettes  . Smokeless tobacco: Never Used  Substance Use Topics  . Alcohol use: Yes    Comment: weekends/wine and beer  . Drug use: No    Comment: denies use 10/11/18     Allergies   Patient has no known allergies.   Review of Systems Review of Systems  Constitutional: Positive for appetite change, chills and fever (subjective).  HENT: Positive for ear pain, mouth sores and sore throat. Negative for congestion, sinus pain, tinnitus and trouble swallowing.   Eyes: Negative for visual disturbance.  Respiratory: Positive for shortness of breath. Negative for cough, chest tightness and wheezing.   Cardiovascular: Negative for chest pain.  Gastrointestinal: Positive for abdominal pain, diarrhea, nausea and vomiting. Negative for abdominal distention and constipation.  Endocrine: Negative for polyuria.  Genitourinary: Negative for dysuria.  Musculoskeletal: Negative for arthralgias, back pain and gait problem.  Skin: Negative for rash and wound.  Allergic/Immunologic: Negative for immunocompromised state.  Neurological: Positive for weakness (generalized). Negative for dizziness, light-headedness and numbness.  Hematological: Does not bruise/bleed easily.     Physical Exam Updated Vital Signs BP (!) 134/112 (BP Location: Right Arm)   Pulse 89   Temp 99 F (37.2 C) (Temporal)   Resp 14   Ht  (1.753 m)   Wt 83.9 kg   SpO2 99%   BMI  27.32 kg/m   Physical Exam Vitals signs and nursing note reviewed.  Constitutional:      Appearance: Normal appearance.  HENT:     Head: Normocephalic and atraumatic.     Right Ear: Tympanic membrane normal.     Left Ear: Tympanic membrane normal.     Nose: Nose normal.     Mouth/Throat:     Mouth: Mucous membranes are moist.     Pharynx: Oropharynx is clear. Uvula midline. No pharyngeal swelling, posterior oropharyngeal erythema or uvula swelling.     Tonsils: No tonsillar exudate or tonsillar abscesses.     Comments: 3mm canker sore on the R inner cheek Eyes:     Conjunctiva/sclera: Conjunctivae normal.     Pupils: Pupils are equal, round, and reactive to light.  Cardiovascular:     Rate and Rhythm: Normal rate and regular rhythm.  Pulmonary:     Effort: Pulmonary effort is normal.     Breath sounds: Normal breath sounds. No wheezing, rhonchi or rales.  Abdominal:     General: Abdomen is flat. There is no distension.     Tenderness: There is no abdominal tenderness. There is no right CVA tenderness, left CVA tenderness, guarding or rebound.  Musculoskeletal:     Right lower leg: No edema.     Left lower leg: No edema.  Lymphadenopathy:     Cervical: No cervical adenopathy.  Skin:    General: Skin is warm.     Capillary Refill: Capillary refill takes less than 2 seconds.  Neurological:     General: No focal deficit present.     Mental Status: He is alert.  Psychiatric:        Mood and Affect: Mood normal.      ED Treatments / Results  Labs (all labs ordered are listed, but only abnormal results are displayed) Labs Reviewed  CBC WITH DIFFERENTIAL/PLATELET - Abnormal; Notable for the following components:      Result Value   Hemoglobin 17.6 (*)    All other components within normal limits  COMPREHENSIVE METABOLIC PANEL - Abnormal; Notable for the following components:   Glucose, Bld 103 (*)    Total Bilirubin 1.5 (*)    All other components within normal limits   LIPASE, BLOOD  URINALYSIS, ROUTINE W REFLEX MICROSCOPIC  MAGNESIUM    EKG None  Radiology No results found.  Procedures Procedures (including critical care time)  Medications Ordered in ED Medications  ondansetron (ZOFRAN) injection 4 mg (4 mg Intravenous Given 10/11/18 2025)  sodium chloride 0.9 % bolus 1,000 mL (0 mLs Intravenous Stopped 10/11/18 2131)     Initial Impression / Assessment and Plan / ED Course  I have reviewed the triage vital signs and the nursing notes.  Pertinent labs & imaging results that were available during my care of the patient were reviewed by me and considered in my medical decision making (see chart for details).  Clinical  Course as of Oct 11 2154  Wynelle Link Oct 11, 2018  1933 Likely viral etiology given constellation of symptoms. Will perform labs, give IV zofran and fluids. Discussed possibility of covid with patient, although less likely. He declined test.  Lorraine Lax was evaluated in Emergency Department on 10/11/2018 for the symptoms described in the history of present illness. He was evaluated in the context of the global COVID-19 pandemic, which necessitated consideration that the patient might be at risk for infection with the SARS-CoV-2 virus that causes COVID-19. Institutional protocols and algorithms that pertain to the evaluation of patients at risk for COVID-19 are in a state of rapid change based on information released by regulatory bodies including the CDC and federal and state organizations. These policies and algorithms were followed during the patient's care in the ED. Belly is soft and nontender and therefore I do not think imaging is needed at this time.      [KM]  2149 Labs are reassuring and normal. Patient improved with fluids and zofran. Will d/c home. Likely viral gastroenteritis. D/c with zofran and return precautions    [KM]    Clinical Course User Index [KM] Arlyn Dunning, PA-C       Based on review of vitals, medical  screening exam, lab work and/or imaging, there does not appear to be an acute, emergent etiology for the patient's symptoms. Counseled pt on good return precautions and encouraged both PCP and ED follow-up as needed.  Prior to discharge, I also discussed incidental imaging findings with patient in detail and advised appropriate, recommended follow-up in detail.  Clinical Impression: 1. Nausea vomiting and diarrhea     Disposition: Discharge  Prior to providing a prescription for a controlled substance, I independently reviewed the patient's recent prescription history on the [place] Controlled Substance Reporting System. The patient had no recent or regular prescriptions and was deemed appropriate for a brief, less than 3 day prescription of narcotic for acute analgesia.  This note was prepared with assistance of Conservation officer, historic buildings. Occasional wrong-word or sound-a-like substitutions may have occurred due to the inherent limitations of voice recognition software.   Final Clinical Impressions(s) / ED Diagnoses   Final diagnoses:  Nausea vomiting and diarrhea    ED Discharge Orders         Ordered    ondansetron (ZOFRAN) 4 MG tablet  Every 8 hours PRN     10/11/18 2155           Jeral Pinch 10/11/18 2156    Cathren Laine, MD 10/12/18 1521

## 2018-10-11 NOTE — ED Triage Notes (Signed)
Pt with N/v/D since Tuesday, chills at home, pt has checked fever.

## 2019-02-03 ENCOUNTER — Encounter (HOSPITAL_COMMUNITY): Payer: Self-pay | Admitting: Emergency Medicine

## 2019-02-03 ENCOUNTER — Other Ambulatory Visit: Payer: Self-pay

## 2019-02-03 ENCOUNTER — Emergency Department (HOSPITAL_COMMUNITY)
Admission: EM | Admit: 2019-02-03 | Discharge: 2019-02-04 | Disposition: A | Discharge status: Home / Self Care | Payer: BC Managed Care – PPO | Attending: Emergency Medicine | Admitting: Emergency Medicine

## 2019-02-03 ENCOUNTER — Emergency Department (HOSPITAL_COMMUNITY): Payer: BC Managed Care – PPO

## 2019-02-03 DIAGNOSIS — Y929 Unspecified place or not applicable: Secondary | ICD-10-CM | POA: Diagnosis not present

## 2019-02-03 DIAGNOSIS — Z79899 Other long term (current) drug therapy: Secondary | ICD-10-CM | POA: Diagnosis not present

## 2019-02-03 DIAGNOSIS — Y999 Unspecified external cause status: Secondary | ICD-10-CM | POA: Diagnosis not present

## 2019-02-03 DIAGNOSIS — M25532 Pain in left wrist: Secondary | ICD-10-CM | POA: Diagnosis not present

## 2019-02-03 DIAGNOSIS — F17210 Nicotine dependence, cigarettes, uncomplicated: Secondary | ICD-10-CM | POA: Insufficient documentation

## 2019-02-03 DIAGNOSIS — Y939 Activity, unspecified: Secondary | ICD-10-CM | POA: Insufficient documentation

## 2019-02-03 DIAGNOSIS — W010XXA Fall on same level from slipping, tripping and stumbling without subsequent striking against object, initial encounter: Secondary | ICD-10-CM | POA: Insufficient documentation

## 2019-02-03 MED ORDER — KETOROLAC TROMETHAMINE 30 MG/ML IJ SOLN
30.0000 mg | Freq: Once | INTRAMUSCULAR | Status: AC
  Administered 2019-02-03: 30 mg via INTRAMUSCULAR
  Filled 2019-02-03: qty 1

## 2019-02-03 NOTE — ED Triage Notes (Signed)
Pt states he fell 1 week ago and injured L wrist.  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  Pain worse today with swelling.

## 2019-02-04 MED ORDER — GABAPENTIN 100 MG PO CAPS: 100.0000 mg | ORAL_CAPSULE | Freq: Three times a day (TID) | ORAL | 0 refills | Status: DC

## 2019-02-04 MED ORDER — OXYCODONE-ACETAMINOPHEN 5-325 MG PO TABS
1.0000 | ORAL_TABLET | Freq: Once | ORAL | Status: AC
  Administered 2019-02-04: 01:00:00 1 via ORAL
  Filled 2019-02-04: qty 1

## 2019-02-04 MED ORDER — METHOCARBAMOL 500 MG PO TABS: 500.0000 mg | ORAL_TABLET | Freq: Two times a day (BID) | ORAL | 0 refills | Status: DC

## 2019-02-04 NOTE — Discharge Instructions (Addendum)
Thank you for allowing me to care for you today in the Emergency Department.   Call [facility] to schedule follow-up appointment with hand surgery regarding her symptoms.  Take 650 mg of Tylenol or 600 mg of ibuprofen with food every 6 hours for pain.  You can alternate between these 2 medications every 3 hours if your pain returns.  For instance, you can take Tylenol at noon, followed by a dose of ibuprofen at 3, followed by second dose of Tylenol and 6.  You can take 1 tablet of Robaxin 2 times daily by mouth to help with muscle pain or spasms.  This medication may make you drowsy.  Do not take it before you work or drive until you know how it impacts you.  Do not take with other substances or medications that may be sedating.  You can try taking 1 tablet of gabapentin every 8 hours to help with the burning nerve pain that you have been having.  Wear the brace at all times to help with pain.  Return to the emergency department if your fingers turn blue, if your forearm gets red and swollen, if you have any fall or injury, or if you develop other new, concerning symptoms.

## 2019-02-04 NOTE — ED Provider Notes (Signed)
[facility] EMERGENCY DEPARTMENT Provider Note   CSN: [id number] Arrival date & time: 02/03/19  2248     History   Chief Complaint Chief Complaint  Patient presents with  . Wrist Pain    HPI Carl Watson is a 33 y.o. male with no significant past medical history who presents to the emergency department with a chief complaint of left wrist pain.  The patient reports that he fell on his outstretched left arm approximately 1 week ago.  He reports that he has been having some left wrist pain since the fall, but reports that the pain has significantly worsened over the last 24 hours.  He has been treating his symptoms with Tylenol and ibuprofen, last dose was 400 mg of ibuprofen this afternoon with minimal improvement.  He reports the pain is throbbing and constant.  The pain intermittently radiates up to the elbow, but he has no left elbow pain or left upper arm pain.    He has also been having tingling and paresthesias intermittently that extend up the forearm. He reports that yesterday he had an episode of numbness in the fingertips of digits 2 through 5 of his left hand that lasted for approximately 45 minutes after he drove his car.  No recurrence of symptoms.  He also notes that yesterday that he went to grip a cup of coffee and ended up dropping the cup because he was unable to grip it.  He states "it kind of feels like my fingers are locking up."  He is right-hand dominant.  No history of previous surgery to the left hand, wrist, or arm.  He is not established with a Hydrographic surveyor.  He denies headache, visual changes, neck or back pain, right upper extremity pain, forearm redness or swelling, or cyanosis.  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  He reports that he was able to complete his shift at work earlier today, but now his wrist pain is worse.     The history is provided by the patient. No language interpreter was  used.    History reviewed. No pertinent past medical history.  There are no active problems to display for this patient.   History reviewed. No pertinent surgical history.      Home Medications    Prior to Admission medications   Medication Sig Start Date End Date Taking? Authorizing Provider  acetaminophen (TYLENOL) 500 MG tablet Take 1,000 mg by mouth every 6 (six) hours as needed. Pain    [provider]  dexamethasone (DECADRON) 4 MG tablet Take 1 tablet (4 mg total) by mouth 2 (two) times daily with a meal. 03/13/15   Ivery Quale, PA-C  gabapentin (NEURONTIN) 100 MG capsule Take 1 capsule (100 mg total) by mouth 3 (three) times daily for 7 days. 02/04/19 02/11/19  Twisha Vanpelt A, PA-C  ibuprofen (ADVIL,MOTRIN) 200 MG tablet Take 400 mg by mouth every 6 (six) hours as needed for pain.    [provider]  methocarbamol (ROBAXIN) 500 MG tablet Take 1 tablet (500 mg total) by mouth 2 (two) times daily. 02/04/19   Breuna Loveall A, PA-C  ondansetron (ZOFRAN) 4 MG tablet Take 1 tablet (4 mg total) by mouth every 8 (eight) hours as needed for nausea or vomiting. 10/11/18   Arlyn Dunning, PA-C  predniSONE (DELTASONE) 20 MG tablet Take 3 tablets (60 mg total) by mouth daily. 10/22/16   Dione Booze,  MD  promethazine-codeine (PHENERGAN WITH CODEINE) 6.25-10 MG/5ML syrup 1 or 2 tsp q6h prn cough 03/13/15   Ivery QualeBryant, Hobson, PA-C  Pseudoephedrine-APAP-DM (DAYQUIL PO) Take 2 capsules by mouth daily.    [provider]  traMADol (ULTRAM) 50 MG tablet Take 1 tablet (50 mg total) by mouth every 6 (six) hours as needed for moderate pain. 01/16/16   Rolland PorterJames, Mark, MD    Family History Family History  Problem Relation Age of Onset  . Diabetes Other     Social History Social History   Tobacco Use  . Smoking status: Current Every Day Smoker    Packs/day: 1.00    Types: Cigarettes  . Smokeless tobacco: Never Used  Substance Use Topics  . Alcohol use: Yes    Comment:  weekends/wine and beer  . Drug use: Yes    Types: Marijuana     Allergies   Patient has no known allergies.   Review of Systems Review of Systems  Constitutional: Negative for activity change.  Respiratory: Negative for shortness of breath.   Cardiovascular: Negative for chest pain.  Gastrointestinal: Negative for abdominal pain, diarrhea, nausea and vomiting.  Musculoskeletal: Positive for arthralgias and myalgias. Negative for back pain, gait problem, joint swelling, neck pain and neck stiffness.  Skin: Negative for rash and wound.  Neurological: Positive for weakness and numbness. Negative for dizziness and syncope.   Physical Exam Updated Vital Signs BP (!) 129/96 (BP Location: Right Arm)   Pulse 96   Temp 99.5 F (37.5 C) (Oral)   Resp 16   SpO2 100%   Physical Exam Vitals signs and nursing note reviewed.  Constitutional:      Appearance: He is well-developed.  HENT:     Head: Normocephalic.  Eyes:     Conjunctiva/sclera: Conjunctivae normal.  Neck:     Musculoskeletal: Neck supple.  Cardiovascular:     Rate and Rhythm: Normal rate and regular rhythm.     Heart sounds: No murmur.  Pulmonary:     Effort: Pulmonary effort is normal.  Abdominal:     General: There is no distension.     Palpations: Abdomen is soft.  Musculoskeletal:        General: Tenderness present.     Comments: Tender to palpation to the flexor retinaculum of the left wrist diffusely.  Negative Tinel test. Increased pain with Eichhoff's test.  Radial pulses are 2+ and symmetric.  Sensation to sharp and soft touch is intact to all 4 distal aspects of all digits of the left hand.  Full active and passive range of motion of the left wrist, elbow, and shoulder.  Increased pain with opposition of the left thumb, but full range of motion of all digits of the left hand.  No swelling, redness, or warmth to the left wrist.  Good capillary refill to all digits of the left hand.  No tenderness over the  anatomic snuffbox.  Skin:    General: Skin is warm and dry.  Neurological:     Mental Status: He is alert.  Psychiatric:        Behavior: Behavior normal.      ED Treatments / Results  Labs (all labs ordered are listed, but only abnormal results are displayed) Labs Reviewed - No data to display  EKG None  Radiology Dg Wrist Complete Left  Result Date: 02/03/2019 CLINICAL DATA:  Fall, wrist pain EXAM: LEFT WRIST - COMPLETE 3+ VIEW COMPARISON:  None. FINDINGS: There is no evidence of fracture or  dislocation. There is no evidence of arthropathy or other focal bone abnormality. Soft tissues are unremarkable. IMPRESSION: Negative. Electronically Signed   By: Rolm Baptise M.D.   On: 02/03/2019 23:21    Procedures Procedures (including critical care time)  Medications Ordered in ED Medications  ketorolac (TORADOL) 30 MG/ML injection 30 mg (30 mg Intramuscular Given 02/03/19 2351)  oxyCODONE-acetaminophen (PERCOCET/ROXICET) 5-325 MG per tablet 1 tablet (1 tablet Oral Given 02/04/19 0040)     Initial Impression / Assessment and Plan / ED Course  I have reviewed the triage vital signs and the nursing notes.  Pertinent labs & imaging results that were available during my care of the patient were reviewed by me and considered in my medical decision making (see chart for details).        33 year old male with new significant past medical history presenting with continued left wrist pain after [place] 1 week ago.  X-ray of the left wrist is negative.  Neurovascularly intact.  Full active and passive range of motion of all joints of the left upper extremity.  No redness or warmth.  Doubt occult fracture, septic joint, gout.  He is mostly tender over the flexor retinaculum on the left wrist and has increased pain with opposition of the left thumb.  Pain does seem to be improved with holding the wrist in anatomic position.  We will place the patient in a Velcro brace and provide him with a  referral to hand surgery for follow-up.  We will start the patient on a low-dose of gabapentin as he has been having paresthesias.  Suspect compression of the median nerve or tendinopathy of the flexor retinaculum.  ER return precautions given.  He is hemodynamically stable and in no acute distress.  Safe for discharge home with outpatient follow-up.  Final Clinical Impressions(s) / ED Diagnoses   Final diagnoses:  Left wrist pain    ED Discharge Orders         Ordered    methocarbamol (ROBAXIN) 500 MG tablet  2 times daily     02/04/19 0039    gabapentin (NEURONTIN) 100 MG capsule  3 times daily     02/04/19 0039           Joanne Gavel, PA-C 24/46/28 6381    Delora Fuel, MD 77/11/65 [phone]

## 2019-02-04 NOTE — ED Notes (Signed)
Pt discharged from ED; instructions provided and scripts given; Pt encouraged to return to ED if symptoms worsen and to f/u with PCP; Pt verbalized understanding of all instructions 

## 2019-06-23 ENCOUNTER — Ambulatory Visit
Admission: EM | Admit: 2019-06-23 | Discharge: 2019-06-23 | Disposition: A | Discharge status: Home / Self Care | Payer: BC Managed Care – PPO | Attending: Emergency Medicine | Admitting: Emergency Medicine

## 2019-06-23 ENCOUNTER — Ambulatory Visit (INDEPENDENT_AMBULATORY_CARE_PROVIDER_SITE_OTHER): Payer: BC Managed Care – PPO

## 2019-06-23 ENCOUNTER — Other Ambulatory Visit: Payer: Self-pay

## 2019-06-23 ENCOUNTER — Encounter: Payer: Self-pay | Admitting: Emergency Medicine

## 2019-06-23 DIAGNOSIS — J90 Pleural effusion, not elsewhere classified: Secondary | ICD-10-CM

## 2019-06-23 DIAGNOSIS — R0781 Pleurodynia: Secondary | ICD-10-CM | POA: Diagnosis not present

## 2019-06-23 HISTORY — DX: Pneumonia, unspecified organism: J18.9

## 2019-06-23 MED ORDER — PREDNISONE 10 MG (21) PO TBPK: ORAL_TABLET | Freq: Every day | ORAL | 0 refills | Status: DC

## 2019-06-23 MED ORDER — DEXAMETHASONE SODIUM PHOSPHATE 10 MG/ML IJ SOLN
10.0000 mg | Freq: Once | INTRAMUSCULAR | Status: AC
  Administered 2019-06-23: 19:00:00 10 mg via INTRAMUSCULAR

## 2019-06-23 MED ORDER — CYCLOBENZAPRINE HCL 10 MG PO TABS: 10.0000 mg | ORAL_TABLET | Freq: Every day | ORAL | 0 refills | Status: DC

## 2019-06-23 NOTE — Discharge Instructions (Addendum)
X-rays positive for pleural effusion, this appears to be a chronic in nature and is stable in CT performed 3 months ago.   Continue conservative management of rest, ice, and gentle stretches Prednisone prescribed.  Take as directed and to completion Take cyclobenzaprine at nighttime for symptomatic relief. Avoid driving or operating heavy machinery while using medication. Follow up with PCP or pulmonary for further evaluation and management Return or go to the ER if you have any new or worsening symptoms (fever, chills, chest pain, shortness of breath, abdominal pain, changes in bowel or bladder habits, etc...)

## 2019-06-23 NOTE — ED Provider Notes (Signed)
[facility]   [id number] 06/23/19 Arrival Time: 1716  CC: Rib cage pain  SUBJECTIVE: History from: patient. Carl Watson is a 34 y.o. male complains of LT lower rib cage pain x 1.5 months.  Denies a precipitating event or specific injury, but works for [employer] and lifts heavy boxes.  Localizes the pain to the LT lower ribs.  Describes the pain as intermittent and achy/ stabbing in character.  8/10.  Has tried 800 mg of ibuprofen and tylenol with codeine without relief.  Symptoms are made worse with movement and deep inspiration.  Denies similar symptoms in the past.  Complains of associated skin changes. Denies fever, chills, erythema, ecchymosis, effusion, weakness, numbness and tingling.  ROS: As per HPI.  All other pertinent ROS negative.     History reviewed. No pertinent past medical history. History reviewed. No pertinent surgical history. No Known Allergies No current facility-administered medications on file prior to encounter.   Current Outpatient Medications on File Prior to Encounter  Medication Sig Dispense Refill  . ibuprofen (ADVIL,MOTRIN) 200 MG tablet Take 400 mg by mouth every 6 (six) hours as needed for pain.    . traMADol (ULTRAM) 50 MG tablet Take 1 tablet (50 mg total) by mouth every 6 (six) hours as needed for moderate pain. 8 tablet 0  . [DISCONTINUED] gabapentin (NEURONTIN) 100 MG capsule Take 1 capsule (100 mg total) by mouth 3 (three) times daily for 7 days. 21 capsule 0   Social History   Socioeconomic History  . Marital status: Single    Spouse name: Not on file  . Number of children: Not on file  . Years of education: Not on file  . Highest education level: Not on file  Occupational History  . Not on file  Tobacco Use  . Smoking status: Current Every Day Smoker    Packs/day: 1.00    Types: Cigarettes  . Smokeless tobacco: Never Used  Substance and Sexual Activity  . Alcohol use: Yes    Comment: weekends/wine and beer  . Drug use: Yes   Types: Marijuana  . Sexual activity: Not on file  Other Topics Concern  . Not on file  Social History Narrative  . Not on file   Social Determinants of Health   Financial Resource Strain:   . Difficulty of Paying Living Expenses: Not on file  Food Insecurity:   . Worried About Programme researcher, broadcasting/film/video in the Last Year: Not on file  . Ran Out of Food in the Last Year: Not on file  Transportation Needs:   . Lack of Transportation (Medical): Not on file  . Lack of Transportation (Non-Medical): Not on file  Physical Activity:   . Days of Exercise per Week: Not on file  . Minutes of Exercise per Session: Not on file  Stress:   . Feeling of Stress : Not on file  Social Connections:   . Frequency of Communication with Friends and Family: Not on file  . Frequency of Social Gatherings with Friends and Family: Not on file  . Attends Religious Services: Not on file  . Active Member of Clubs or Organizations: Not on file  . Attends Banker Meetings: Not on file  . Marital Status: Not on file  Intimate Partner Violence:   . Fear of Current or Ex-Partner: Not on file  . Emotionally Abused: Not on file  . Physically Abused: Not on file  . Sexually Abused: Not on file   Family History  Problem Relation Age of Onset  . Diabetes Other     OBJECTIVE:  Vitals:   06/23/19 1737  BP: 127/81  Pulse: 95  Resp: 20  Temp: 99.2 F (37.3 C)  TempSrc: Oral  SpO2: 95%    General appearance: ALERT; in no acute distress.  Head: NCAT Lungs: Normal respiratory effort; CTAB CV: RRR Musculoskeletal: Rib cage Inspection: No obvious erythema, rash or wounds; mild hyperpigmentation over LTlateral ribs Palpation: TTP over LT lateral ribcage, and posterior ribs Skin: warm and dry Neurologic: Ambulates without difficulty Psychological: alert and cooperative; normal mood and affect  DIAGNOSTIC STUDIES:  DG Chest 2 View  Result Date: 06/23/2019 CLINICAL DATA:  Rib pain for 1 month.  Left-sided pain with movement or deep inspiration. Patient reports discoloration of left rib cage. EXAM: CHEST - 2 VIEW COMPARISON:  Radiograph and CT 04/12/2019 FINDINGS: Small left pleural effusion with streaky subpleural opacities in the left mid lung, likely residual scarring. Findings are not significantly changed from prior radiograph and CT. Chronic bronchial thickening throughout both lungs again seen. No pneumothorax. No new airspace disease. Normal heart size and mediastinal contours. No evidence of acute osseous abnormality. Bb demarcating site of pain overlies lower left ribs, subjacent ribs appear grossly normal. IMPRESSION: 1. Small left pleural effusion. Streaky linear and subpleural opacities in the left lung. Findings are not significantly changed from radiograph and CT 3 months ago and appear chronic, likely an element of underlying scarring. 2. Chronic bronchial thickening. Electronically Signed   By: Keith Rake M.D.   On: 06/23/2019 17:56    X-rays positive for small left pleural effusion  I have reviewed the x-rays myself and the radiologist interpretation. I am in agreement with the radiologist interpretation.     ASSESSMENT & PLAN:  1. Rib pain on left side   2. Pleural effusion on left    Meds ordered this encounter  Medications  . predniSONE (STERAPRED UNI-PAK 21 TAB) 10 MG (21) TBPK tablet    Sig: Take by mouth daily. Take 6 tabs by mouth daily  for 2 days, then 5 tabs for 2 days, then 4 tabs for 2 days, then 3 tabs for 2 days, 2 tabs for 2 days, then 1 tab by mouth daily for 2 days    Dispense:  42 tablet    Refill:  0    Order Specific Question:   Supervising Provider    Answer:   Raylene Everts [[id number]]  . dexamethasone (DECADRON) injection 10 mg  . cyclobenzaprine (FLEXERIL) 10 MG tablet    Sig: Take 1 tablet (10 mg total) by mouth at bedtime.    Dispense:  15 tablet    Refill:  0    Order Specific Question:   Supervising Provider    Answer:   Raylene Everts [[id number]]   Steroid shot give in office X-rays positive for pleural effusion, this appears to be a chronic in nature and is stable in CT performed 3 months ago.   Continue conservative management of rest, ice, and gentle stretches Prednisone prescribed.  Take as directed and to completion Take cyclobenzaprine at nighttime for symptomatic relief. Avoid driving or operating heavy machinery while using medication. Follow up with PCP or pulmonary for further evaluation and management Return or go to the ER if you have any new or worsening symptoms (fever, chills, chest pain, shortness of breath, abdominal pain, changes in bowel or bladder habits, etc...)   Instructed patient to go to the ED if  symptoms did not improve with treatment over the next 24-48 hours.     Reviewed expectations re: course of current medical issues. Questions answered. Outlined signs and symptoms indicating need for more acute intervention. Patient verbalized understanding. After Visit Summary given.    Rennis Harding, PA-C 06/23/19 1822

## 2019-06-23 NOTE — ED Triage Notes (Signed)
Left mid torso pain with movement or deep inspiration  This pain has been present for a month or 1 1/2 months.  Patient reports discoloration to left ribcage.  No known injury  Patient works at [employer], lifts a lot of boxes.

## 2021-10-18 IMAGING — DX DG CHEST 2V
2 series · 2 of 2 positions shown · non-contrast
Comparison: Radiograph and CT 04/12/2019

CLINICAL DATA: Rib pain for 1 month. Left-sided pain with movement
or deep inspiration. Patient reports discoloration of left rib cage.

EXAM:
CHEST - 2 VIEW

[chest pa]
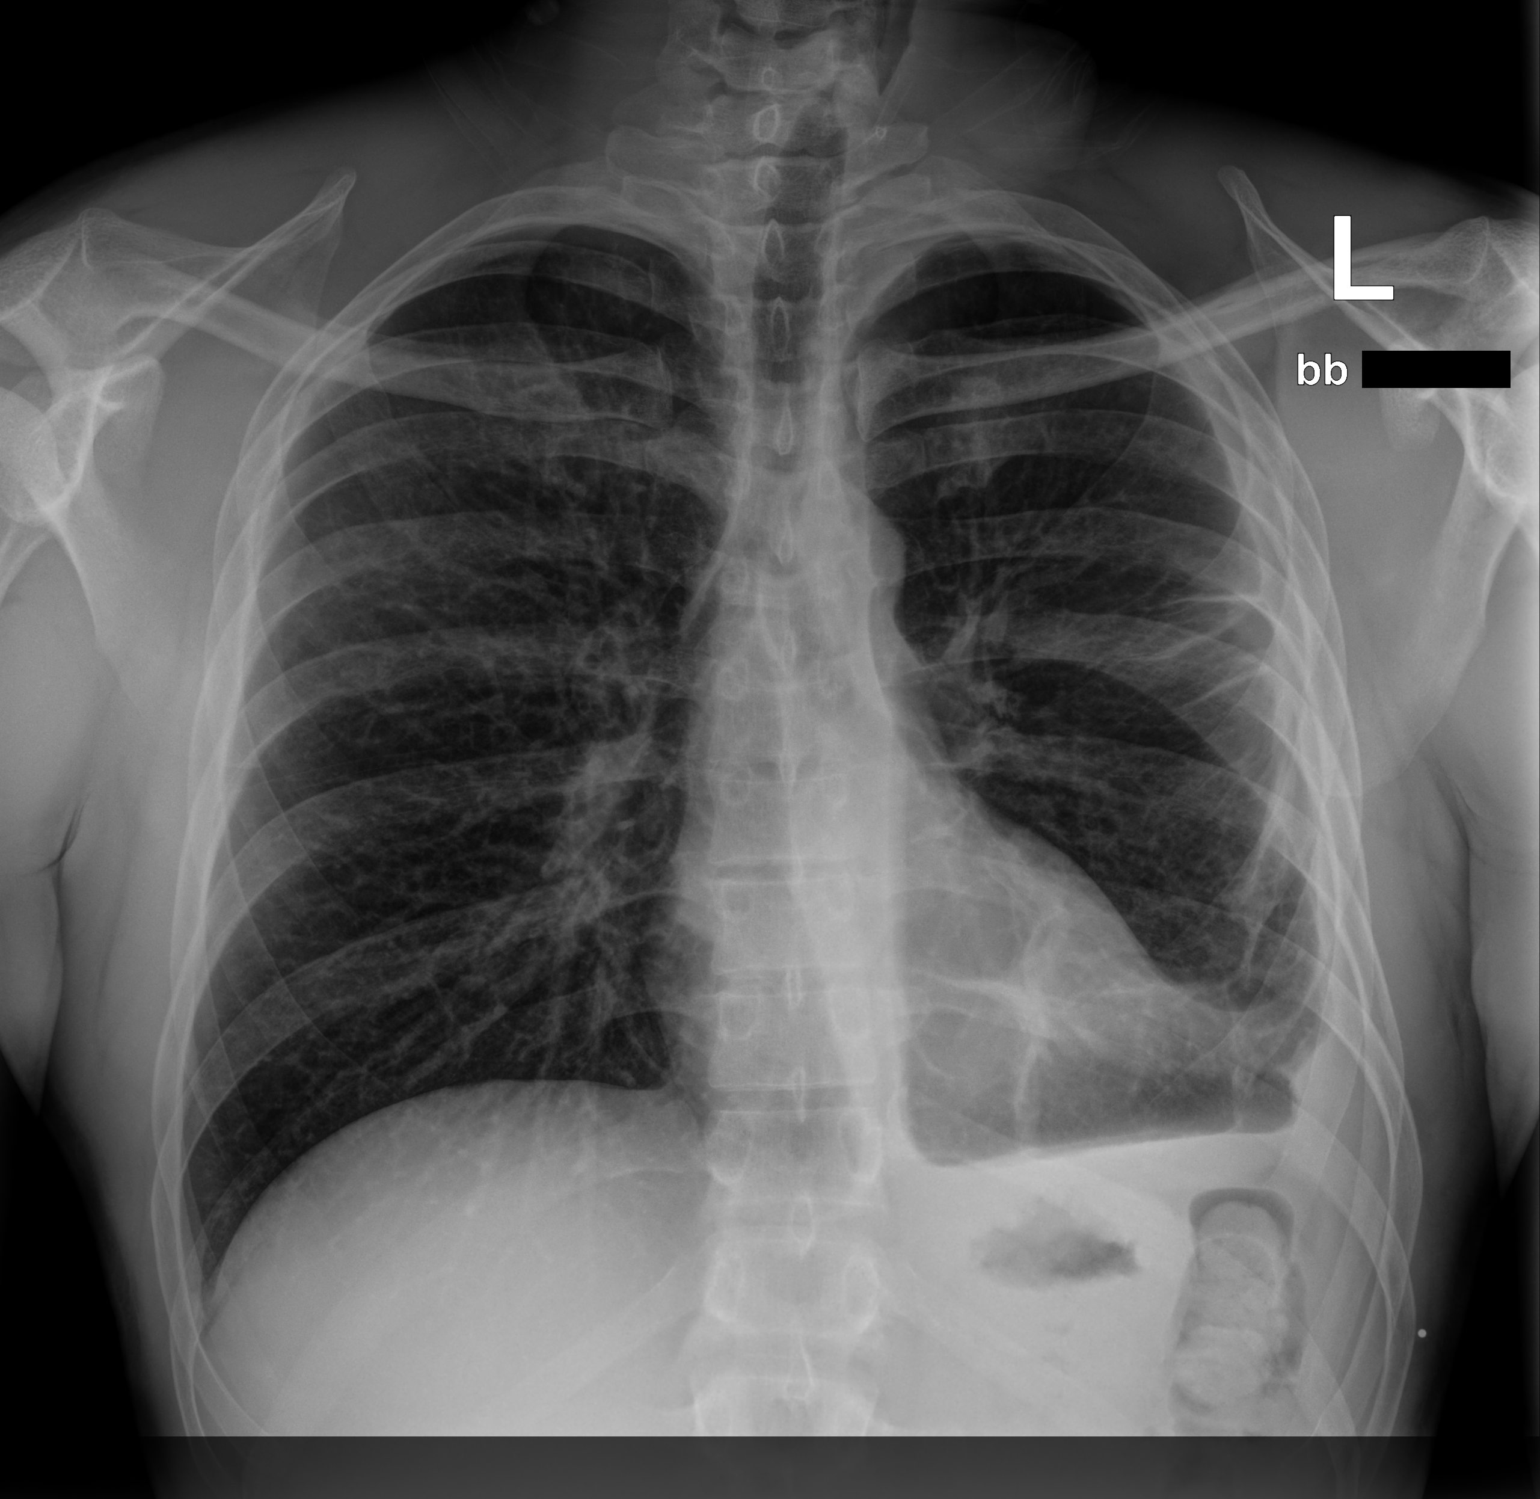

[chest lat]
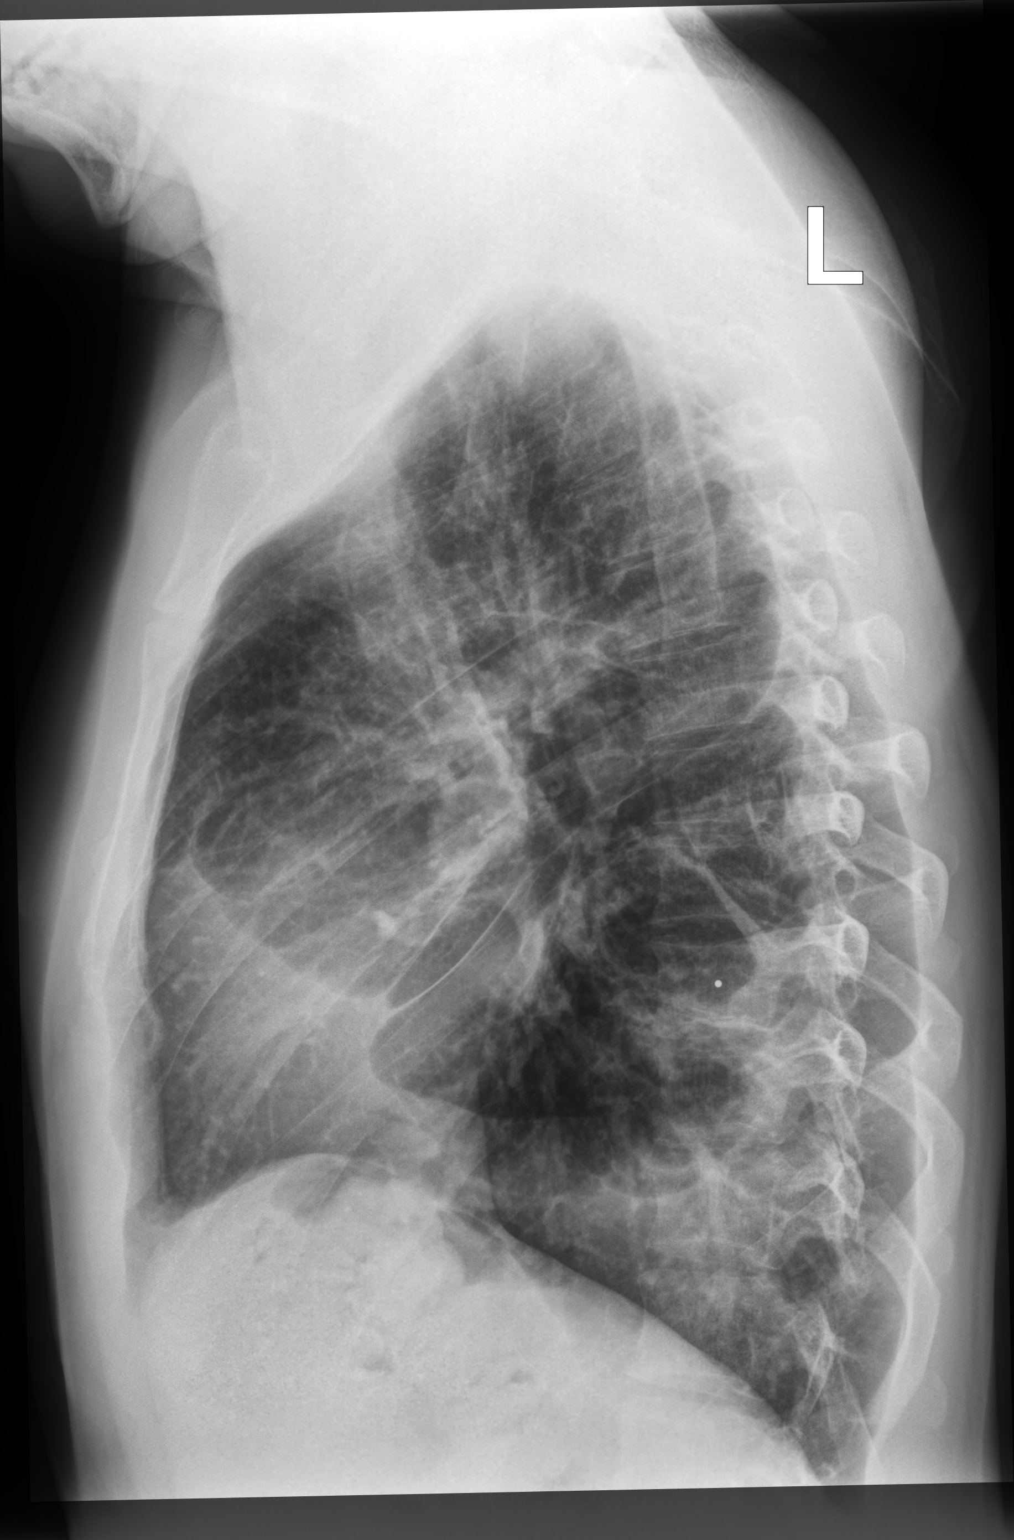

[2 of 2 positions shown; findings below may reference images not displayed]

FINDINGS: Small left pleural effusion with streaky subpleural opacities in the
left mid lung, likely residual scarring. Findings are not
significantly changed from prior radiograph and CT. Chronic
bronchial thickening throughout both lungs again seen. No
pneumothorax. No new airspace disease. Normal heart size and
mediastinal contours. No evidence of acute osseous abnormality. Bb
demarcating site of pain overlies lower left ribs, subjacent ribs
appear grossly normal.
IMPRESSION: 1. Small left pleural effusion. Streaky linear and subpleural
opacities in the left lung. Findings are not significantly changed
from radiograph and CT 3 months ago and appear chronic, likely an
element of underlying scarring.
2. Chronic bronchial thickening.

## 2022-01-19 ENCOUNTER — Other Ambulatory Visit: Payer: Self-pay

## 2022-01-19 ENCOUNTER — Emergency Department (HOSPITAL_COMMUNITY)
Admission: EM | Admit: 2022-01-19 | Discharge: 2022-01-20 | Disposition: A | Discharge status: Home / Self Care | Payer: BC Managed Care – PPO | Attending: Emergency Medicine | Admitting: Emergency Medicine

## 2022-01-19 ENCOUNTER — Encounter (HOSPITAL_COMMUNITY): Payer: Self-pay | Admitting: Emergency Medicine

## 2022-01-19 DIAGNOSIS — T402X1A Poisoning by other opioids, accidental (unintentional), initial encounter: Secondary | ICD-10-CM | POA: Insufficient documentation

## 2022-01-19 DIAGNOSIS — T40601A Poisoning by unspecified narcotics, accidental (unintentional), initial encounter: Secondary | ICD-10-CM

## 2022-01-19 DIAGNOSIS — Z5329 Procedure and treatment not carried out because of patient's decision for other reasons: Secondary | ICD-10-CM

## 2022-01-19 LAB — CBC WITH DIFFERENTIAL/PLATELET
Abs Immature Granulocytes: 0.22 10*3/uL — ABNORMAL HIGH (ref 0.00–0.07)
Basophils Absolute: 0.1 10*3/uL (ref 0.0–0.1)
Basophils Relative: 1 %
Eosinophils Absolute: 0.1 10*3/uL (ref 0.0–0.5)
Eosinophils Relative: 1 %
HCT: 49.5 % (ref 39.0–52.0)
Hemoglobin: 16.5 g/dL (ref 13.0–17.0)
Immature Granulocytes: 2 %
Lymphocytes Relative: 31 %
Lymphs Abs: 4.6 10*3/uL — ABNORMAL HIGH (ref 0.7–4.0)
MCH: 31 pg (ref 26.0–34.0)
MCHC: 33.3 g/dL (ref 30.0–36.0)
MCV: 92.9 fL (ref 80.0–100.0)
Monocytes Absolute: 1.2 10*3/uL — ABNORMAL HIGH (ref 0.1–1.0)
Monocytes Relative: 8 %
Neutro Abs: 8.5 10*3/uL — ABNORMAL HIGH (ref 1.7–7.7)
Neutrophils Relative %: 57 %
Platelets: 229 10*3/uL (ref 150–400)
RBC: 5.33 MIL/uL (ref 4.22–5.81)
RDW: 12.7 % (ref 11.5–15.5)
WBC: 14.7 10*3/uL — ABNORMAL HIGH (ref 4.0–10.5)
nRBC: 0 % (ref 0.0–0.2)

## 2022-01-19 MED ORDER — LACTATED RINGERS IV BOLUS
1000.0000 mL | Freq: Once | INTRAVENOUS | Status: AC
  Administered 2022-01-19: 1000 mL via INTRAVENOUS

## 2022-01-19 MED ORDER — NALOXONE HCL 2 MG/2ML IJ SOSY: 1.0000 mg | PREFILLED_SYRINGE | Freq: Once | INTRAMUSCULAR | Status: DC

## 2022-01-19 NOTE — ED Notes (Signed)
Pt is "Carl Watson". Talked with pt and discussed the importance of staying for a while to make sure pt was ok and back to baseline. Pt verbally understood

## 2022-01-19 NOTE — ED Provider Notes (Signed)
[facility] EMERGENCY DEPARTMENT Provider Note   CSN: [id number] Arrival date & time: 01/19/22  2335     History {Add pertinent medical, surgical, social history, OB history to HPI:1} Chief Complaint  Patient presents with   Possible Overdose    Carl Watson is a 36 y.o. male.  Brought in by family for being unresponsive. Initially not able to really communicate history just stating that he had some juice.   After Narcan, patient states he did some cocaine and alcohol. No other known drugs. No complaints at this time. No chest pain, palpitations, trauma or other symptoms.         Home Medications Prior to Admission medications   Medication Sig Start Date End Date Taking? Authorizing Provider  cyclobenzaprine (FLEXERIL) 10 MG tablet Take 1 tablet (10 mg total) by mouth at bedtime. 06/23/19   Wurst, [place], PA-C  ibuprofen (ADVIL,MOTRIN) 200 MG tablet Take 400 mg by mouth every 6 (six) hours as needed for pain.    [provider]  predniSONE (STERAPRED UNI-PAK 21 TAB) 10 MG (21) TBPK tablet Take by mouth daily. Take 6 tabs by mouth daily  for 2 days, then 5 tabs for 2 days, then 4 tabs for 2 days, then 3 tabs for 2 days, 2 tabs for 2 days, then 1 tab by mouth daily for 2 days 06/23/19   Alvino Chapel, [place], PA-C  traMADol (ULTRAM) 50 MG tablet Take 1 tablet (50 mg total) by mouth every 6 (six) hours as needed for moderate pain. 01/16/16   Rolland Porter, MD  gabapentin (NEURONTIN) 100 MG capsule Take 1 capsule (100 mg total) by mouth 3 (three) times daily for 7 days. 02/04/19 06/23/19  McDonald, Mia A, PA-C      Allergies    Patient has no known allergies.    Review of Systems   Review of Systems  Physical Exam Updated Vital Signs BP (!) 184/115 (BP Location: Left Arm)   Pulse (!) 116   Temp 98.5 F (36.9 C) (Oral)   Resp (!) 29   Ht 5\' 9"  (1.753 m)   Wt 84 kg   SpO2 93%   BMI 27.35 kg/m  Physical Exam Vitals and nursing note reviewed.  Constitutional:       Comments: lethargic  Eyes:     Pupils: Pupils are equal, round, and reactive to light.  Cardiovascular:     Rate and Rhythm: Tachycardia present.     Comments: And hypertensive Abdominal:     General: Abdomen is flat. There is no distension.     Tenderness: There is no abdominal tenderness.  Musculoskeletal:        General: No swelling. Normal range of motion.  Skin:    General: Skin is warm and dry.     ED Results / Procedures / Treatments   Labs (all labs ordered are listed, but only abnormal results are displayed) Labs Reviewed - No data to display  EKG None  Radiology No results found.  Procedures .Critical Care  Performed by: , MD Authorized by: Marily Memos, MD   Critical care provider statement:    Critical care time (minutes):  30   Critical care was necessary to treat or prevent imminent or life-threatening deterioration of the following conditions:  CNS failure or compromise   Critical care was time spent personally by me on the following activities:  Development of treatment plan with patient or surrogate, discussions with consultants, evaluation of patient's response to treatment, examination of  patient, ordering and review of laboratory studies, ordering and review of radiographic studies, ordering and performing treatments and interventions, pulse oximetry, re-evaluation of patient's condition and review of old charts   {Document cardiac monitor, telemetry assessment procedure when appropriate:1}  Medications Ordered in ED Medications - No data to display  ED Course/ Medical Decision Making/ A&P                           Medical Decision Making Patient initially hypoxic, bradypneic with pinpoint pupils and minimally responsive. No e/o trauma. Seemed to be consistent with opiate overdose, 1 mg Narcan with nearly immediate improvement in MS and respiratory effort. States he only used Coke, may have been laced? Will check lytes, ecg and observe  for a couple half lifes of narcan. Rx for narcan on d/c.  ***  {Document critical care time when appropriate:1} {Document review of labs and clinical decision tools ie heart score, Chads2Vasc2 etc:1}  {Document your independent review of radiology images, and any outside records:1} {Document your discussion with family members, caretakers, and with consultants:1} {Document social determinants of health affecting pt's care:1} {Document your decision making why or why not admission, treatments were needed:1} Final Clinical Impression(s) / ED Diagnoses Final diagnoses:  None    Rx / DC Orders ED Discharge Orders     None

## 2022-01-19 NOTE — ED Triage Notes (Signed)
Pt dropped off by family after stating he had passed out in car. Pt responsive and states he was "juiced". Pt given 1mg  Narcan and responded well to that.

## 2022-01-20 LAB — COMPREHENSIVE METABOLIC PANEL
ALT: 78 U/L — ABNORMAL HIGH (ref 0–44)
AST: 73 U/L — ABNORMAL HIGH (ref 15–41)
Albumin: 4.3 g/dL (ref 3.5–5.0)
Alkaline Phosphatase: 117 U/L (ref 38–126)
Anion gap: 13 (ref 5–15)
BUN: 10 mg/dL (ref 6–20)
CO2: 22 mmol/L (ref 22–32)
Calcium: 8.4 mg/dL — ABNORMAL LOW (ref 8.9–10.3)
Chloride: 101 mmol/L (ref 98–111)
Creatinine, Ser: 1.43 mg/dL — ABNORMAL HIGH (ref 0.61–1.24)
GFR, Estimated: >60 mL/min (ref >60)
Glucose, Bld: 275 mg/dL — ABNORMAL HIGH (ref 70–99)
Potassium: 3.1 mmol/L — ABNORMAL LOW (ref 3.5–5.1)
Sodium: 136 mmol/L (ref 135–145)
Total Bilirubin: 0.7 mg/dL (ref 0.3–1.2)
Total Protein: 7.4 g/dL (ref 6.5–8.1)

## 2022-01-20 LAB — MAGNESIUM: Magnesium: 2.5 mg/dL — ABNORMAL HIGH (ref 1.7–2.4)

## 2022-01-20 MED ORDER — NALOXONE HCL 4 MG/0.1ML NA LIQD: NASAL | 1 refills | Status: AC

## 2022-01-20 MED ORDER — NALOXONE HCL 2 MG/2ML IJ SOSY
1.0000 mg | PREFILLED_SYRINGE | Freq: Once | INTRAMUSCULAR | Status: AC
  Administered 2022-01-19: 1 mg via INTRAVENOUS

## 2022-01-20 NOTE — ED Notes (Signed)
ED Provider at bedside. 

## 2022-01-22 LAB — CBG MONITORING, ED: Glucose-Capillary: 290 mg/dL — ABNORMAL HIGH (ref 70–99)
